# Patient Record
Sex: Female | Born: 1935 | Race: White | Hispanic: No | Smoking: Never smoker
Health system: Southern US, Community
[De-identification: ages and names within clinical notes are randomized; demographics above are authoritative.]

## PROBLEM LIST (undated history)

## (undated) DIAGNOSIS — I5030 Unspecified diastolic (congestive) heart failure: Secondary | ICD-10-CM

## (undated) DIAGNOSIS — E079 Disorder of thyroid, unspecified: Secondary | ICD-10-CM

## (undated) DIAGNOSIS — I509 Heart failure, unspecified: Secondary | ICD-10-CM

## (undated) DIAGNOSIS — G4730 Sleep apnea, unspecified: Secondary | ICD-10-CM

## (undated) DIAGNOSIS — C55 Malignant neoplasm of uterus, part unspecified: Secondary | ICD-10-CM

## (undated) DIAGNOSIS — I272 Pulmonary hypertension, unspecified: Secondary | ICD-10-CM

## (undated) DIAGNOSIS — S92901A Unspecified fracture of right foot, initial encounter for closed fracture: Secondary | ICD-10-CM

## (undated) DIAGNOSIS — I10 Essential (primary) hypertension: Secondary | ICD-10-CM

## (undated) DIAGNOSIS — N289 Disorder of kidney and ureter, unspecified: Secondary | ICD-10-CM

## (undated) HISTORY — PX: BACK SURGERY: SHX140

## (undated) HISTORY — PX: TOTAL HIP ARTHROPLASTY: SHX124

## (undated) HISTORY — DX: Malignant neoplasm of uterus, part unspecified: C55

## (undated) HISTORY — PX: ABDOMINAL HYSTERECTOMY: SHX81

## (undated) HISTORY — PX: KNEE ARTHROSCOPY: SUR90

---

## 1998-01-20 ENCOUNTER — Other Ambulatory Visit: Admission: RE | Admit: 1998-01-20 | Discharge: 1998-01-20 | Payer: Self-pay | Admitting: Family Medicine

## 1998-07-17 ENCOUNTER — Ambulatory Visit (HOSPITAL_COMMUNITY): Admission: RE | Admit: 1998-07-17 | Discharge: 1998-07-17 | Payer: Self-pay | Admitting: Family Medicine

## 2000-06-13 ENCOUNTER — Encounter: Payer: Self-pay | Admitting: Family Medicine

## 2000-06-13 ENCOUNTER — Encounter: Admission: RE | Admit: 2000-06-13 | Discharge: 2000-06-13 | Payer: Self-pay | Admitting: Family Medicine

## 2001-08-07 ENCOUNTER — Encounter: Payer: Self-pay | Admitting: Family Medicine

## 2001-08-07 ENCOUNTER — Encounter: Admission: RE | Admit: 2001-08-07 | Discharge: 2001-08-07 | Payer: Self-pay | Admitting: Family Medicine

## 2002-05-12 ENCOUNTER — Encounter: Admission: RE | Admit: 2002-05-12 | Discharge: 2002-05-12 | Payer: Self-pay | Admitting: Family Medicine

## 2002-05-12 ENCOUNTER — Encounter: Payer: Self-pay | Admitting: Family Medicine

## 2002-08-05 ENCOUNTER — Encounter: Admission: RE | Admit: 2002-08-05 | Discharge: 2002-08-05 | Payer: Self-pay | Admitting: Family Medicine

## 2002-08-05 ENCOUNTER — Encounter: Payer: Self-pay | Admitting: Family Medicine

## 2003-04-05 ENCOUNTER — Ambulatory Visit (HOSPITAL_COMMUNITY): Admission: RE | Admit: 2003-04-05 | Discharge: 2003-04-05 | Payer: Self-pay | Admitting: Neurosurgery

## 2003-04-27 ENCOUNTER — Inpatient Hospital Stay (HOSPITAL_COMMUNITY): Admission: RE | Admit: 2003-04-27 | Discharge: 2003-05-05 | Payer: Self-pay | Admitting: Neurosurgery

## 2003-05-31 ENCOUNTER — Inpatient Hospital Stay (HOSPITAL_COMMUNITY): Admission: AD | Admit: 2003-05-31 | Discharge: 2003-06-06 | Payer: Self-pay | Admitting: Neurosurgery

## 2003-06-06 ENCOUNTER — Encounter: Payer: Self-pay | Admitting: Neurosurgery

## 2003-06-13 ENCOUNTER — Encounter (HOSPITAL_COMMUNITY): Admission: RE | Admit: 2003-06-13 | Discharge: 2003-09-11 | Payer: Self-pay | Admitting: Neurosurgery

## 2003-06-15 ENCOUNTER — Encounter (HOSPITAL_COMMUNITY): Admission: RE | Admit: 2003-06-15 | Discharge: 2003-07-14 | Payer: Self-pay | Admitting: Neurosurgery

## 2003-06-19 ENCOUNTER — Encounter: Payer: Self-pay | Admitting: Emergency Medicine

## 2003-06-19 ENCOUNTER — Inpatient Hospital Stay (HOSPITAL_COMMUNITY): Admission: EM | Admit: 2003-06-19 | Discharge: 2003-06-23 | Payer: Self-pay

## 2003-06-19 ENCOUNTER — Encounter: Payer: Self-pay | Admitting: Family Medicine

## 2003-06-22 ENCOUNTER — Encounter: Payer: Self-pay | Admitting: Internal Medicine

## 2003-09-14 ENCOUNTER — Encounter: Admission: RE | Admit: 2003-09-14 | Discharge: 2003-09-14 | Payer: Self-pay | Admitting: Family Medicine

## 2003-10-26 ENCOUNTER — Encounter: Admission: RE | Admit: 2003-10-26 | Discharge: 2003-10-26 | Payer: Self-pay | Admitting: Infectious Diseases

## 2003-12-14 ENCOUNTER — Encounter: Admission: RE | Admit: 2003-12-14 | Discharge: 2003-12-14 | Payer: Self-pay | Admitting: Infectious Diseases

## 2004-03-05 ENCOUNTER — Inpatient Hospital Stay (HOSPITAL_COMMUNITY): Admission: RE | Admit: 2004-03-05 | Discharge: 2004-03-12 | Payer: Self-pay | Admitting: Orthopedic Surgery

## 2004-04-17 ENCOUNTER — Inpatient Hospital Stay (HOSPITAL_COMMUNITY): Admission: EM | Admit: 2004-04-17 | Discharge: 2004-04-30 | Payer: Self-pay | Admitting: Emergency Medicine

## 2004-04-18 ENCOUNTER — Encounter: Payer: Self-pay | Admitting: Cardiology

## 2004-04-30 ENCOUNTER — Encounter (HOSPITAL_COMMUNITY): Admission: RE | Admit: 2004-04-30 | Discharge: 2004-06-22 | Payer: Self-pay | Admitting: Orthopedic Surgery

## 2004-05-07 ENCOUNTER — Encounter: Admission: RE | Admit: 2004-05-07 | Discharge: 2004-05-07 | Payer: Self-pay | Admitting: Infectious Diseases

## 2004-06-22 ENCOUNTER — Ambulatory Visit: Payer: Self-pay | Admitting: Infectious Diseases

## 2004-08-08 ENCOUNTER — Ambulatory Visit: Payer: Self-pay | Admitting: Infectious Diseases

## 2004-11-26 ENCOUNTER — Encounter: Admission: RE | Admit: 2004-11-26 | Discharge: 2004-11-26 | Payer: Self-pay | Admitting: Family Medicine

## 2005-05-09 ENCOUNTER — Encounter: Admission: RE | Admit: 2005-05-09 | Discharge: 2005-05-09 | Payer: Self-pay | Admitting: Gastroenterology

## 2005-05-28 ENCOUNTER — Ambulatory Visit (HOSPITAL_COMMUNITY): Admission: RE | Admit: 2005-05-28 | Discharge: 2005-05-28 | Payer: Self-pay | Admitting: Gastroenterology

## 2005-11-05 ENCOUNTER — Ambulatory Visit (HOSPITAL_COMMUNITY): Admission: RE | Admit: 2005-11-05 | Discharge: 2005-11-06 | Payer: Self-pay | Admitting: Ophthalmology

## 2006-05-07 ENCOUNTER — Encounter: Admission: RE | Admit: 2006-05-07 | Discharge: 2006-05-07 | Payer: Self-pay | Admitting: Family Medicine

## 2007-06-17 ENCOUNTER — Encounter: Admission: RE | Admit: 2007-06-17 | Discharge: 2007-06-17 | Payer: Self-pay | Admitting: Family Medicine

## 2008-08-10 ENCOUNTER — Encounter: Admission: RE | Admit: 2008-08-10 | Discharge: 2008-08-10 | Payer: Self-pay | Admitting: Family Medicine

## 2009-07-07 ENCOUNTER — Ambulatory Visit: Payer: Self-pay | Admitting: Pulmonary Disease

## 2009-07-07 ENCOUNTER — Inpatient Hospital Stay (HOSPITAL_COMMUNITY): Admission: EM | Admit: 2009-07-07 | Discharge: 2009-07-12 | Payer: Self-pay | Admitting: Emergency Medicine

## 2010-06-30 ENCOUNTER — Emergency Department (HOSPITAL_COMMUNITY)
Admission: EM | Admit: 2010-06-30 | Discharge: 2010-06-30 | Payer: Self-pay | Source: Home / Self Care | Admitting: Emergency Medicine

## 2010-07-24 ENCOUNTER — Encounter: Payer: Self-pay | Admitting: Cardiology

## 2010-08-06 ENCOUNTER — Encounter: Payer: Self-pay | Admitting: Cardiology

## 2010-08-09 ENCOUNTER — Ambulatory Visit: Payer: Self-pay | Admitting: Cardiology

## 2010-08-09 DIAGNOSIS — R0609 Other forms of dyspnea: Secondary | ICD-10-CM | POA: Insufficient documentation

## 2010-08-09 DIAGNOSIS — I498 Other specified cardiac arrhythmias: Secondary | ICD-10-CM

## 2010-08-09 DIAGNOSIS — I5032 Chronic diastolic (congestive) heart failure: Secondary | ICD-10-CM

## 2010-08-09 DIAGNOSIS — R0989 Other specified symptoms and signs involving the circulatory and respiratory systems: Secondary | ICD-10-CM

## 2010-08-13 ENCOUNTER — Telehealth (INDEPENDENT_AMBULATORY_CARE_PROVIDER_SITE_OTHER): Payer: Self-pay | Admitting: *Deleted

## 2010-08-21 ENCOUNTER — Encounter: Payer: Self-pay | Admitting: Pulmonary Disease

## 2010-08-21 ENCOUNTER — Encounter: Payer: Self-pay | Admitting: Cardiology

## 2010-08-21 ENCOUNTER — Ambulatory Visit (HOSPITAL_BASED_OUTPATIENT_CLINIC_OR_DEPARTMENT_OTHER)
Admission: RE | Admit: 2010-08-21 | Discharge: 2010-08-21 | Payer: Self-pay | Source: Home / Self Care | Admitting: Cardiology

## 2010-08-22 ENCOUNTER — Ambulatory Visit: Payer: Self-pay | Admitting: Pulmonary Disease

## 2010-08-23 ENCOUNTER — Ambulatory Visit: Payer: Self-pay | Admitting: Cardiology

## 2010-08-23 ENCOUNTER — Ambulatory Visit (HOSPITAL_COMMUNITY)
Admission: RE | Admit: 2010-08-23 | Discharge: 2010-08-23 | Payer: Self-pay | Source: Home / Self Care | Admitting: Cardiology

## 2010-08-23 ENCOUNTER — Ambulatory Visit: Payer: Self-pay

## 2010-08-23 ENCOUNTER — Encounter: Payer: Self-pay | Admitting: Cardiology

## 2010-08-28 ENCOUNTER — Ambulatory Visit: Payer: Self-pay | Admitting: Cardiology

## 2010-08-28 DIAGNOSIS — I2789 Other specified pulmonary heart diseases: Secondary | ICD-10-CM

## 2010-08-28 DIAGNOSIS — R0602 Shortness of breath: Secondary | ICD-10-CM

## 2010-08-28 DIAGNOSIS — I10 Essential (primary) hypertension: Secondary | ICD-10-CM | POA: Insufficient documentation

## 2010-08-29 LAB — CONVERTED CEMR LAB
BUN: 23 mg/dL (ref 6–23)
CO2: 32 meq/L (ref 19–32)
Chloride: 100 meq/L (ref 96–112)
Creatinine, Ser: 1.1 mg/dL (ref 0.4–1.2)
Glucose, Bld: 87 mg/dL (ref 70–99)

## 2010-08-30 ENCOUNTER — Ambulatory Visit: Payer: Self-pay | Admitting: Pulmonary Disease

## 2010-08-30 DIAGNOSIS — G4733 Obstructive sleep apnea (adult) (pediatric): Secondary | ICD-10-CM

## 2010-09-06 ENCOUNTER — Ambulatory Visit (HOSPITAL_COMMUNITY)
Admission: RE | Admit: 2010-09-06 | Discharge: 2010-09-06 | Payer: Self-pay | Source: Home / Self Care | Attending: Cardiology | Admitting: Cardiology

## 2010-09-20 ENCOUNTER — Telehealth (INDEPENDENT_AMBULATORY_CARE_PROVIDER_SITE_OTHER): Payer: Self-pay | Admitting: *Deleted

## 2010-09-28 ENCOUNTER — Encounter: Payer: Self-pay | Admitting: Pulmonary Disease

## 2010-10-11 ENCOUNTER — Ambulatory Visit (HOSPITAL_BASED_OUTPATIENT_CLINIC_OR_DEPARTMENT_OTHER)
Admission: RE | Admit: 2010-10-11 | Discharge: 2010-10-11 | Payer: Self-pay | Source: Home / Self Care | Attending: Pulmonary Disease | Admitting: Pulmonary Disease

## 2010-10-11 ENCOUNTER — Encounter: Payer: Self-pay | Admitting: Pulmonary Disease

## 2010-10-21 LAB — CONVERTED CEMR LAB
BUN: 23 mg/dL (ref 6–23)
Chloride: 99 meq/L (ref 96–112)
Creatinine, Ser: 1.2 mg/dL (ref 0.4–1.2)
GFR calc non Af Amer: 47.73 mL/min (ref >60)
Glucose, Bld: 80 mg/dL (ref 70–99)

## 2010-10-23 NOTE — Procedures (Signed)
Summary: summary report  summary report   Imported By: Mirna Mires 08/28/2010 16:30:50  _____________________________________________________________________  External Attachment:    Type:   Image     Comment:   External Document

## 2010-10-23 NOTE — Progress Notes (Signed)
Summary: holter monitor  Phone Note Outgoing Call   Call placed by: Marcos Eke,  August 13, 2010 1:24 PM Action Taken: Appt scheduled Summary of Call: Pt would like to have monitor the same day of echo Initial call taken by: Marcos Eke,  August 13, 2010 1:24 PM

## 2010-10-23 NOTE — Letter (Signed)
Summary: Physician Order [facility]  Physician Order for [facility]   Imported By: Marylou Mccoy 08/15/2010 14:22:51  _____________________________________________________________________  External Attachment:    Type:   Image     Comment:   External Document

## 2010-10-23 NOTE — Assessment & Plan Note (Signed)
Summary: np6/dx:SOB;edema/lg   Visit Type:  Initial Consult Primary Provider:  Mady Gemma   History of Present Illness: 77 yo with history of morbid obesity and suspected obesity-hypoventilation syndrome presents for cardiology evaluation in the setting of shortness of breath.  Patient has a long history of dyspnea.  She has been short of breath just walking around her house for several years.  She was admitted to [facility] in 10/10 with shortness of breath and was treated for CHF and obesity-hypoventilation.  She was sent home on oxygen but subsequently stopped it.  She was supposed to followup with pulmonology for a sleep study but it does not appear that this was ever done.  She denies chest pain, lightheadedness, or syncope.  Main limitation is simply significant shortness of breath that significantly limits her.  She has some orthopnea and sleeps on her side.  She has chronic lower leg swelling.  No PND.  Finally, when she was hospitalized in 10/10 she was noted to have bradycardia and pauses on telemetry.  She actually had a sinus pause on her ECG done here today.   O2 sat at rest 92%, O2 sat with ambution 88%.   ECG: NSR but had episode of sinus pause at the end of the ECG.   Labs (11/11): BNP 154, HCT 39.9  Current Medications (verified): 1)  Benicar 40 Mg Tabs (Olmesartan Medoxomil) .... Take One Tablet By Mouth Daily 2)  Furosemide 40 Mg Tabs (Furosemide) .... Take One Tablet By Mouth Two Times A Day 3)  Taztia Xt 360 Mg Xr24h-Cap (Diltiazem Hcl Er Beads) .... Once Daily 4)  Voltaren-Xr 100 Mg Xr24h-Tab (Diclofenac Sodium) .... Once Daily 5)  Fluconazole 200 Mg Tabs (Fluconazole) .... Uad 6)  Levoxyl 137 Mcg Tabs (Levothyroxine Sodium) .... Once Daily 7)  Meclizine Hcl 25 Mg Tabs (Meclizine Hcl) .... As Needed 8)  Omeprazole 20 Mg Cpdr (Omeprazole) .... Two Times A Day  Allergies: 1)  ! * Crestor 2)  ! Keflex 3)  ! Norvasc 4)  ! Zocor  Past History:  Past Medical  History: 1. Hypertension: ACEI cough 2. Hyperlipidemia 3. Hypothyroidism 4. Past history of MRSA 5. Peptic ulcer disease 6. History of cervical cancer 7. Obesity-hypoventilation syndrome, suspect OSA 8. Fe deficiency anemia 9. Morbid obesity 10. Nocturnal bradycardia 11. Echo (7/05): Technically difficult study with EF 55-65%.  12. s/p total hip replacement 13. GERD  Family History: Reviewed history from 08/07/2010 and no changes required. Significant for father with lung cancer A brother with acute MI  Social History: Tobacco Use - No.  Alcohol Use - no Drug Use - no Widowed  Daughter comes to appointments.   Review of Systems       All systems reviewed and negative except as per HPI.   Vital Signs:  Patient profile:   77 year old female Height:      68 inches Weight:      361 pounds BMI:     55.09 Pulse rate:   74 / minute BP sitting:   140 / 88  (left arm)  Vitals Entered By: Laurance Flatten CMA (August 09, 2010 11:40 AM) Comments Baseline o2 sat. 92 pulse 59  After walking o2 sat. 88 pulse 96    Physical Exam  General:  Well developed, well nourished, in no acute distress. Morbidly obese.  Head:  normocephalic and atraumatic Nose:  no deformity, discharge, inflammation, or lesions Mouth:  Teeth, gums and palate normal. Oral mucosa normal. Neck:  Neck  supple, JVP 10 cm. No masses, thyromegaly or abnormal cervical nodes. Lungs:  Clear bilaterally to auscultation and percussion. Heart:  Non-displaced PMI, chest non-tender; regular rate and rhythm, S1, S2 without rubs or gallops. 2/6 early systolic murmur RUSB.  Carotid upstroke normal, no bruit.  2+ edema to knees bilaterally.  Pedal pulses difficult due to edema.  Abdomen:  Bowel sounds positive; abdomen soft and non-tender without masses, organomegaly, or hernias noted. No hepatosplenomegaly. Extremities:  No clubbing or cyanosis. Neurologic:  Alert and oriented x 3. Skin:  Intact without lesions or  rashes. Psych:  Normal affect.   Impression & Recommendations:  Problem # 1:  CHRONIC DIASTOLIC HEART FAILURE (ICD-428.32) Patient is morbidly obese with evidence for volume overload on exam.  I suspect that her dyspnea is multifactorial: diastolic CHF, obesity/deconditioning, also suspect obesity-hypoventilation syndrome and possible OSA as well as possible pulmonary hypertension from obesity-hypoventilation.  BNP mildly elevated on recent lab draw.  - Increase Lasix to 40 mg by mouth two times a day (only taking it once a day) with KCl 20 mEq daily.  BMET/BNP in 2 wks.  - Echocardiogram with attention to right ventricle and PA pressure. - Sodium restriction - Referral to pulmonary for evaluation of OHS/OSA.  Will also arrange sleep study.    Problem # 2:  MORBID OBESITY (ICD-278.01) Weight loss is paramount.  Will refer to nutrition.   Problem # 3:  BRADYCARDIA (ICD-427.89) History of bradycardia with pauses.  This has been noted in the past and she has been asymptomatic.  HR 74 today but short pause noted on ECG.  She is on diltiazem CD.  I will get a 48 hour holter and will likely need to stop diltiazem.  I suspect that sleep apnea/hypoxia could be causing the patient's bradycardia and this needs to be treated.   Other Orders: Holter Monitor (Holter Monitor) Misc. Referral (Misc. Ref) Sleep Disorder Referral (Sleep Disorder) Echocardiogram (Echo) Pulmonary Referral (Pulmonary) TLB-BMP (Basic Metabolic Panel-BMET) (80048-METABOL)  Patient Instructions: 1)  Your physician has recommended you make the following change in your medication:  2)  Take Aspirin 81mg  daily--this should be buffered or coated. 3)  Take Lasix(furosemide) 40mg  twice a day--take one in the morning and one in the afternoon around 3 or 4 PM. 4)  Take KCL(potassium) 20 mEq daily. 5)  Lab  today--BMP 428.32 6)  Your physician recommends that you schedule a follow-up appointment in: 2 weeks with Dr Shirlee Latch.  7)  Lab  in 2 weeks when you see Dr Brodan Grewell--BMP/BNP 428.32 8)  Your physician has requested that you have an echocardiogram.  Echocardiography is a painless test that uses sound waves to create images of your heart. It provides your doctor with information about the size and shape of your heart and how well your heart's chambers and valves are working.  This procedure takes approximately one hour. There are no restrictions for this procedure. 9)  Your physician has recommended that you wear a holter monitor.  Holter monitors are medical devices that record the heart's electrical activity. Doctors most often use these monitors to diagnose arrhythmias. Arrhythmias are problems with the speed or rhythm of the heartbeat. The monitor is a small, portable device. You can wear one while you do your normal daily activities. This is usually used to diagnose what is causing palpitations/syncope (passing out). 48 hour monitor 10)  Your physician has recommended that you have a sleep study.  This test records several body functions during sleep, including:  brain activity, eye movement, oxygen and carbon dioxide blood levels, heart rate and rhythm, breathing rate and rhythm, the flow of air through your mouth and nose, snoring, body muscle movements, and chest and belly movement. 11)  Your physician has requested that you limit the intake of sodium (salt) in your diet to 1500mg  sodium  daily. Please see [facility] handout. 12)  You have been referred to a nutritionist. 13)  You have been referred to pulmonary. Prescriptions: KLOR-CON M20 20 MEQ CR-TABS (POTASSIUM CHLORIDE CRYS CR) one daily  #30 x 6   Entered by:   Katina Dung, RN, BSN   Authorized by:   Marca Ancona, MD   Signed by:   Katina Dung, RN, BSN on 08/09/2010   Method used:   Electronically to        [facility]  [address] [phone][facility] (retail)       [address]       [place], [place]  [address]       Ph: [phone] or [phone]       Fax: [phone]    RxID:   [id number]   Pt  declined nutrition consult Luana Shu

## 2010-10-23 NOTE — Letter (Signed)
Summary: [facility] Orders   [facility] Orders   Imported By: Roderic Ovens 08/28/2010 15:48:14  _____________________________________________________________________  External Attachment:    Type:   Image     Comment:   External Document

## 2010-10-23 NOTE — Assessment & Plan Note (Signed)
Summary: 2 WKS/D.MILLER   Visit Type:  2 wks f/u Primary Provider:  Mady Gemma  CC:  edema/legs but states this is getting better and sob w/walking....denies any cp.  History of Present Illness: 77 yo with history of morbid obesity and suspected obesity-hypoventilation syndrome returns for cardiology evaluation in the setting of shortness of breath.  Patient has a long history of dyspnea.  She has been short of breath just walking around her house for several years.  She was admitted to [facility] in 10/10 with shortness of breath and was treated for CHF and obesity-hypoventilation.  She was sent home on oxygen but subsequently stopped it.  She was supposed to followup with pulmonology for a sleep study but it does not appear that this was ever done.  She denies chest pain, lightheadedness, or syncope.  Main limitation is simply shortness of breath that significantly limits her.  She has some orthopnea and sleeps on her side.  She has chronic lower leg swelling.  No PND.  At last appointment,  O2 sat at rest 92% and O2 sat with ambulation 88%.   At last appointment, I had her start Lasix 40 mg two times a day.  She has been taking this as ordered since then and has lost 4 lbs since last appointment.  She thinks is breathing better, though she still needs a walker to get around and gets short of breath walking around the house.  She had a sleep study done showing severe sleep apnea with sinus pauses corresponding to apneic episodes.  Hotler showed multiple sinus pauses up to 4 seconds.  She denies lightheadedness or syncope.  Echo showed normal systolic function with a mildly dilated RV and moderate pulmonary hypertension.    Labs (11/11): BNP 154, HCT 39.9  Current Medications (verified): 1)  Benicar 40 Mg Tabs (Olmesartan Medoxomil) .... Take One Tablet By Mouth Daily 2)  Furosemide 40 Mg Tabs (Furosemide) .... Take One Tablet By Mouth Two Times A Day 3)  Taztia Xt 360 Mg Xr24h-Cap (Diltiazem  Hcl Er Beads) .... Once Daily 4)  Voltaren-Xr 100 Mg Xr24h-Tab (Diclofenac Sodium) .... Once Daily 5)  Fluconazole 200 Mg Tabs (Fluconazole) .... Uad 6)  Levoxyl 137 Mcg Tabs (Levothyroxine Sodium) .... Once Daily 7)  Meclizine Hcl 25 Mg Tabs (Meclizine Hcl) .... As Needed 8)  Omeprazole 20 Mg Cpdr (Omeprazole) .... Two Times A Day 9)  Klor-Con M20 20 Meq Cr-Tabs (Potassium Chloride Crys Cr) .... One Daily  Allergies: 1)  ! * Crestor 2)  ! Keflex 3)  ! Norvasc 4)  ! Zocor  Past History:  Past Medical History: 1. Hypertension: ACEI cough 2. Hyperlipidemia 3. Hypothyroidism 4. Past history of MRSA 5. Peptic ulcer disease 6. History of cervical cancer 7. OHS/OSA: Sleep study with severe OSA (11/11).  Oxygen sat 92% at rest, 88% with ambulation.  8. Fe deficiency anemia 9. Morbid obesity 10. Nocturnal bradycardia: Holter (11/11) with multiple sinus pauses up to 4 seconds in length.   11. Pulmonary hypertension: Echo (11/11) with EF 55-60%, grade I diastolic dysfunction, mild LV hypertrophy, mildly dilated RV with PA systolic pressure 51 mmHg.  12. s/p total hip replacement 13. GERD  Family History: Reviewed history from 08/07/2010 and no changes required. Significant for father with lung cancer A brother with acute MI  Social History: Reviewed history from 08/09/2010 and no changes required. Tobacco Use - No.  Alcohol Use - no Drug Use - no Widowed  Daughter comes to appointments.  Review of Systems       All systems reviewed and negative except as per HPI.   Vital Signs:  Patient profile:   77 year old female Height:      68 inches Weight:      357.75 pounds BMI:     54.59 Pulse rate:   76 / minute Pulse rhythm:   irregular BP sitting:   148 / 70  (right arm) Cuff size:   large  Vitals Entered By: Vikki Ports (August 28, 2010 12:23 PM)  Physical Exam  General:  Well developed, well nourished, in no acute distress. Morbidly obese.  Neck:  Neck supple,  JVP 8 cm. No masses, thyromegaly or abnormal cervical nodes. Lungs:  Clear bilaterally to auscultation and percussion. Heart:  Non-displaced PMI, chest non-tender; regular rate and rhythm, S1, S2 without rubs or gallops. 2/6 early systolic murmur RUSB.  Carotid upstroke normal, no bruit.  1+ edema 1/2 up lower legs bilaterally. Pedal pulses difficult due to edema.  Abdomen:  Bowel sounds positive; abdomen soft and non-tender without masses, organomegaly, or hernias noted. No hepatosplenomegaly. Extremities:  No clubbing or cyanosis. Neurologic:  Alert and oriented x 3. Psych:  Normal affect.   Impression & Recommendations:  Problem # 1:  BRADYCARDIA (ICD-427.89) Nocturnal bradycardia with sinus pauses on holter up to 4 seconds.  On sleep study, had sinus pauses corresponding to apneic episodes.  No syncope or presyncope.  First step will be treatment of OSA as this may resolve the bradyarrhythmias.  I will also stop diltiazem.    Problem # 2:  SHORTNESS OF BREATH (ICD-786.05) I suspect that this is due to obesity/deconditioning, OHS/OSA, and pulmonary hypertension.  She does continue to have some volume overload on exam but taking Lasix 40 mg two times a day has helped.  Would continue Lasix at this dose.  She needs to get on CPAP.  She is going to see Dr. Shelle Iron later this week.   Problem # 3:  PULMONARY HYPERTENSION (ICD-416.8) Moderate pulmonary hypertension by echo.  I suspect that this is due primarily to hypoxemia from OHS/OSA.  She needs to start CPAP, and ? whether she may need oxygen during day (only desaturated to 88% with ambulation but given pulmonary hypertension would have a low threshold to use oxygen).  I will get a V/Q scan to rule out chronic thromboembolic disease.   Problem # 4:  HYPERTENSION, UNSPECIFIED (ICD-401.9) Discontinue diltiazem given bradyarrhythmias.  Will start amlodipine 5 mg daily for BP control.    Other Orders: Misc. Referral (Misc. Ref) T-2 View CXR  (71020TC)  Patient Instructions: 1)  Your physician recommends that you have lab today--BMP/BNP 786.09 416.8 2)  Stop Diltiazem/Tiazac. 3)  Start Norvasc 5mg  daily. 4)  We will schedule you for a VQ lung scan. 5)  Keep the appointment Thursday December 8 at 2:15  with Dr Norma Fredrickson pulmonary doctor. 6)  Your physician recommends that you schedule a follow-up appointment in: 2 months with Dr Shirlee Latch. Prescriptions: NORVASC 5 MG TABS (AMLODIPINE BESYLATE) one daily  #30 x 6   Entered by:   Katina Dung, RN, BSN   Authorized by:   Marca Ancona, MD   Signed by:   Katina Dung, RN, BSN on 08/28/2010   Method used:   [facility] to        [facility]  [address] [phone][facility] ([facility]ail[facility]       [address]  [place], [place]  [address]       Ph: [phone] or [phone]       Fax: [phone]   RxID:   [id number]

## 2010-10-23 NOTE — Letter (Signed)
Summary: [facility] - Office Visit  [facility] - Office Visit   Imported By: Marylou Mccoy 08/15/2010 13:23:14  _____________________________________________________________________  External Attachment:    Type:   Image     Comment:   External Document

## 2010-10-25 DIAGNOSIS — I499 Cardiac arrhythmia, unspecified: Secondary | ICD-10-CM

## 2010-10-25 DIAGNOSIS — I4949 Other premature depolarization: Secondary | ICD-10-CM

## 2010-10-25 DIAGNOSIS — G4733 Obstructive sleep apnea (adult) (pediatric): Secondary | ICD-10-CM

## 2010-10-25 NOTE — Letter (Signed)
Summary: Statement of Medical Necessity / [facility]  Statement of Medical Necessity / [facility]   Imported By: Lennie Odor 10/04/2010 17:21:00  _____________________________________________________________________  External Attachment:    Type:   Image     Comment:   External Document

## 2010-10-25 NOTE — Assessment & Plan Note (Signed)
Summary: consult for severe sleep apnea   Visit Type:  Initial Consult Copy to:  Marca Ancona MD Primary Provider/Referring Provider:  Mady Gemma  CC:  abnormal sleep study and shortness of breath.  History of Present Illness: the pt is a 77y/o female who I have been asked to see for management of osa.  She has had a recent npsg where she was found to have very severe sleep apnea with AHI 111/hr, and desat to 80%.  She was also found to have sinus pauses associated with her apnea.  She is a known snorer, but no one has mentioned pauses in her breathing during sleep.  The pt typically goes to bed at 10pm, and arises at 7am to start her day.  She is never rested in the am's upon arising, and describes significant daytime sleepiness while trying to read or watch tv.  She denies sleepiness while driving.  She tells me her weight has increased 50 pounds in the last 2 years.  The pt has also been noted to have multifactorial dyspnea, and recent echo does show pulmonary hypertension.    Current Medications (verified): 1)  Benicar 40 Mg Tabs (Olmesartan Medoxomil) .... Take One Tablet By Mouth Daily 2)  Furosemide 40 Mg Tabs (Furosemide) .... Take One Tablet By Mouth Two Times A Day 3)  Norvasc 5 Mg Tabs (Amlodipine Besylate) .... One Daily 4)  Voltaren-Xr 100 Mg Xr24h-Tab (Diclofenac Sodium) .... Once Daily 5)  Fluconazole 200 Mg Tabs (Fluconazole) .... Uad 6)  Levoxyl 137 Mcg Tabs (Levothyroxine Sodium) .... Once Daily 7)  Meclizine Hcl 25 Mg Tabs (Meclizine Hcl) .... As Needed 8)  Omeprazole 20 Mg Cpdr (Omeprazole) .... Two Times A Day 9)  Klor-Con M20 20 Meq Cr-Tabs (Potassium Chloride Crys Cr) .... One Daily 10)  Aspirin 81 Mg Tbec (Aspirin) .... One Daily  Allergies (verified): No Known Drug Allergies  Past History:  Past Medical History: Reviewed history from 08/28/2010 and no changes required. 1. Hypertension: ACEI cough 2. Hyperlipidemia 3. Hypothyroidism 4. Past history of  MRSA 5. Peptic ulcer disease 6. History of cervical cancer 7. OHS/OSA: Sleep study with severe OSA (11/11).  Oxygen sat 92% at rest, 88% with ambulation.  8. Fe deficiency anemia 9. Morbid obesity 10. Nocturnal bradycardia: Holter (11/11) with multiple sinus pauses up to 4 seconds in length.   11. Pulmonary hypertension: Echo (11/11) with EF 55-60%, grade I diastolic dysfunction, mild LV hypertrophy, mildly dilated RV with PA systolic pressure 51 mmHg.  12. s/p total hip replacement 13. GERD  Past Surgical History: Reviewed history from 08/07/2010 and no changes required.  Lumbar laminectomy infusion, October, 2004 (which  subsequently became infected the following month).  Hysterectomy  Hemorrhoidectomy  Most recent left total hip replacement arthroplasty in June of 2005  Family History: Reviewed history from 08/07/2010 and no changes required. Significant for father with lung cancer A brother with acute MI  Social History: Reviewed history from 08/09/2010 and no changes required. Never Smoked Alcohol Use - no Drug Use - no Widowed  Daughter comes to appointments.  retired: Insurance underwriter order  Review of Systems       The patient complains of shortness of breath with activity, hand/feet swelling, and joint stiffness or pain.  The patient denies shortness of breath at rest, productive cough, non-productive cough, coughing up blood, chest pain, irregular heartbeats, acid heartburn, indigestion, loss of appetite, weight change, abdominal pain, difficulty swallowing, sore throat, tooth/dental problems, headaches, nasal congestion/difficulty breathing through nose,  sneezing, itching, ear ache, anxiety, depression, rash, change in color of mucus, and fever.    Vital Signs:  Patient profile:   77 year old female Height:      68 inches Weight:      360.25 pounds BMI:     54.97 O2 Sat:      95 % on Room air Temp:     97.8 degrees F oral Pulse rate:   75 / minute BP sitting:   130 /  80  (left arm) Cuff size:   large  Vitals Entered By: Carver Fila (77 year old female Height:  Room air CC: abnormal sleep study and shortness of breath Comments meds and allergies updated Phone number updated Mindy Silva  August 30, 2010 2:30 PM    Physical Exam  General:  morbidly obese female in nad Eyes:  PERRLA and EOMI.   Nose:  edematous mucosa and turbinates with partial obstruction bilat. Mouth:  very small posterior pharyngeal space, elongation of soft palate and uvula. Neck:  large, no jvd, tmg, LN Lungs:  clear to auscultation, no wheezing or rhonchi Heart:  rrr, 2/6 sem Abdomen:  soft and nontender, bs+ Extremities:  2+ edema, varicosities noted. no cyanosis pulses intact distally but decreased. Neurologic:  alert and oriented, moves all 4.   Impression & Recommendations:  Problem # 1:  OBSTRUCTIVE SLEEP APNEA (ICD-327.23) the pt has very severe osa by her sleep study with associated sinus pauses.  She also has known pulmonary hypertension which is probably due to her osa along with diastolic dysfunction.  I have had a long discussion with the pt about sleep apnea, including its impact on QOL and CV health.  The only adequate treatment options for her at this point are cpap and weight loss.  The pt is willing to give it a try.  I will set the patient up on cpap at a moderate pressure level to allow for desensitization, and will troubleshoot the device over the next 4-6weeks if needed.  The pt is to call me if having issues with tolerance.  Will then optimize the pressure once patient is able to wear cpap on a consistent basis.  Other Orders: Consultation Level IV (74259) DME Referral (DME) Sleep Disorder Referral (Sleep Disorder)  Patient Instructions: 1)  will start on cpap at a moderate level to begin treatment and allow you to adapt to the device 2)  will set up for titration study, given the severity of your sleep apnea, in order to optimize your  pressure for you. 3)  work on weight loss 4)  will see back after your titration study, but please call if having any tolerance issues with the cpap.   Immunization History:  Influenza Immunization History:    Influenza:  historical (07/24/2010)

## 2010-10-25 NOTE — Progress Notes (Signed)
Summary: insurance change/sleep study-[id number]  Phone Note Call from Patient Call back at [facility] Phone [phone]   Caller: Patient Call For: dr clance Summary of Call: Patient phoned she was scheduled for a sleep test tonight but was seen by her PCP and she has bronchitis and was told by the sleep center not to come for the test tonight. Patients insurance is changing as of 09/23/10 and her new insurance [employer] [place] [employer] reqires prior authorization from the doctor for the sleep study. Patient can be reached at [phone]  Initial call taken by: Vedia Coffer,  September 20, 2010 3:23 PM  Follow-up for Phone Call        [email or web address]. Carron Curie CMA  September 20, 2010 4:43 PM  pt states she will have new insurance as of 09/23/10 and it will be [employer] [place] [employer]--address--[employer] H[employer]- [address] [address], [place], [place] [address]--ph# [phone] VF#[id number]---AYTKZ # [id number], pt states new appt for sleep study will need precert, pt advised she will hear from [place] or Almyra Free once the new appt for sleep study and precert have been done  Follow-up by: Philipp Deputy CMA,  September 20, 2010 4:59 PM  Additional Follow-up for Phone Call Additional follow up Details #1::        spoke to [facility] MEDICARE  cpt code 23557 for cpap titration study does not require precert pt is aware  Additional Follow-up by: Oneita Jolly,  September 21, 2010 9:16 AM

## 2010-11-06 ENCOUNTER — Ambulatory Visit (INDEPENDENT_AMBULATORY_CARE_PROVIDER_SITE_OTHER): Payer: Medicare Other | Admitting: Pulmonary Disease

## 2010-11-06 ENCOUNTER — Encounter: Payer: Self-pay | Admitting: Pulmonary Disease

## 2010-11-06 DIAGNOSIS — G4733 Obstructive sleep apnea (adult) (pediatric): Secondary | ICD-10-CM

## 2010-11-06 DIAGNOSIS — G2589 Other specified extrapyramidal and movement disorders: Secondary | ICD-10-CM | POA: Insufficient documentation

## 2010-11-14 NOTE — Assessment & Plan Note (Signed)
Summary: rov to review cpap titration study.   Copy to:  Marca Ancona MD Primary Provider/Referring Provider:  Mady Gemma  CC:  Ov to discuss sleep study results.  Pt states she wears her cpap machine every night.  Approx 8 hours per night.  Pt c/o hoarseness since starting cpap and also c/o watery L eye that she thinks is d/t the mask. Marland Kitchen  History of Present Illness: the pt comes in today for review of her recent cpap titration study.  She was found to have an optimal pressure of 14cm, but also had very large numbers of leg jerks with significant sleep disruption.  It should be noted however, her limb movements significantly improved with optimal cpap pressure.  She was also noted to have occasional pvc's and fusion beats, as well as occasional episode of irregularity that may be transient afib??  I have reviewed the study in detail with her and her daughter, and answered all of their questions.  The pt feels she is sleeping much better with cpap, and her only complaint is that of "air blowing in my eyes and puffiness around my eye".    Current Medications (verified): 1)  Benicar 40 Mg Tabs (Olmesartan Medoxomil) .... Take One Tablet By Mouth Daily 2)  Furosemide 40 Mg Tabs (Furosemide) .... Take One Tablet By Mouth Two Times A Day 3)  Norvasc 5 Mg Tabs (Amlodipine Besylate) .... One Daily 4)  Voltaren-Xr 100 Mg Xr24h-Tab (Diclofenac Sodium) .... Once Daily 5)  Fluconazole 200 Mg Tabs (Fluconazole) .... Uad 6)  Levoxyl 137 Mcg Tabs (Levothyroxine Sodium) .... Once Daily 7)  Meclizine Hcl 25 Mg Tabs (Meclizine Hcl) .... As Needed 8)  Omeprazole 20 Mg Cpdr (Omeprazole) .... Take 1 Tablet By Mouth Once A Day 9)  Klor-Con M20 20 Meq Cr-Tabs (Potassium Chloride Crys Cr) .... One Daily  Allergies (verified): No Known Drug Allergies  Review of Systems       The patient complains of productive cough.  The patient denies shortness of breath with activity, shortness of breath at rest,  non-productive cough, coughing up blood, chest pain, irregular heartbeats, acid heartburn, indigestion, loss of appetite, weight change, abdominal pain, difficulty swallowing, sore throat, tooth/dental problems, headaches, nasal congestion/difficulty breathing through nose, sneezing, itching, ear ache, anxiety, depression, hand/feet swelling, joint stiffness or pain, rash, change in color of mucus, and fever.    Vital Signs:  Patient profile:   77 year old female Height:      68 inches Weight:      360.38 pounds BMI:     54.99 O2 Sat:      95 % on Room air Temp:     97.7 degrees F oral Pulse rate:   82 / minute BP sitting:   188 / 100  (left arm) Cuff size:   large  Vitals Entered By: Arman Filter LPN (November 06, 2010 12:01 PM)  O2 Flow:  Room air CC: Ov to discuss sleep study results.  Pt states she wears her cpap machine every night.  Approx 8 hours per night.  Pt c/o hoarseness since starting cpap and also c/o watery L eye that she thinks is d/t the mask.  Comments Medications reviewed with patient Arman Filter LPN  November 06, 2010 12:05 PM    Physical Exam  General:  obese female in nad Nose:  no skin breakdown or pressure necrosis from cpap mask Extremities:  edema noted, but no cyanosis  Neurologic:  alert, does not appear sleepy, moves  all 4    Impression & Recommendations:  Problem # 1:  OBSTRUCTIVE SLEEP APNEA (ICD-327.23) the pt has known very severe osa, and her recent titration study showed optimal pressure near 14cm.  The pt is having mask issues, and will need troubleshooting by her dme.  She has responded very favorably to cpap so far, and feels she is sleeping better with improved daytime alertness.  I have reiterated to her again the importance of weight loss  Problem # 2:  PERIODIC LIMB MOVEMENT DISORDER (ICD-333.99) the pt had very frequent leg kicks on her sleep study, but they resolved with optimal cpap.  This would tend to indicate they are more  related to her sleep disordered breathing than a primary movement disorder of sleep.  However, her clinical history is more suggestive of RLS, but she has many other reasons for leg discomfort.  At this point, would like to treat the pt with optimal cpap and see how she does.  If her legs continue to be a major issue, would consider an emperic trial of a dopamine agonist.  Medications Added to Medication List This Visit: 1)  Omeprazole 20 Mg Cpdr (Omeprazole) .... Take 1 tablet by mouth once a day  Other Orders: Est. Patient Level III (16109) DME Referral (DME)  Patient Instructions: 1)  will have your cpap pressure increased to 14cm, and will also have dme check your mask fit. 2)  please call me in 3-4 weeks, and give me update on how cpap is going, and if your leg symptoms are better. 3)  work on weight loss 4)  followup with me in office in 6mos if things are going well.

## 2010-12-05 LAB — URINALYSIS, ROUTINE W REFLEX MICROSCOPIC
Bilirubin Urine: NEGATIVE
Hgb urine dipstick: NEGATIVE
Ketones, ur: NEGATIVE mg/dL
Leukocytes, UA: NEGATIVE
Specific Gravity, Urine: 1.021 (ref 1.005–1.030)
Urobilinogen, UA: 0.2 mg/dL (ref 0.0–1.0)

## 2010-12-05 LAB — POCT I-STAT, CHEM 8
BUN: 17 mg/dL (ref 6–23)
Calcium, Ion: 1.1 mmol/L — ABNORMAL LOW (ref 1.12–1.32)
Creatinine, Ser: 1.3 mg/dL — ABNORMAL HIGH (ref 0.4–1.2)
HCT: 46 % (ref 36.0–46.0)
Sodium: 139 mEq/L (ref 135–145)
TCO2: 29 mmol/L (ref 0–100)

## 2010-12-05 LAB — URINE CULTURE: Culture  Setup Time: 201110091201

## 2010-12-05 LAB — DIFFERENTIAL
Basophils Absolute: 0 10*3/uL (ref 0.0–0.1)
Eosinophils Absolute: 0 10*3/uL (ref 0.0–0.7)
Eosinophils Relative: 0 % (ref 0–5)
Monocytes Absolute: 1.5 10*3/uL — ABNORMAL HIGH (ref 0.1–1.0)
Neutrophils Relative %: 92 % — ABNORMAL HIGH (ref 43–77)

## 2010-12-05 LAB — CULTURE, BLOOD (ROUTINE X 2)
Culture  Setup Time: 201110090105
Culture  Setup Time: 201110090105

## 2010-12-05 LAB — CBC: RBC: 4.76 MIL/uL (ref 3.87–5.11)

## 2010-12-27 LAB — BASIC METABOLIC PANEL
BUN: 11 mg/dL (ref 6–23)
BUN: 15 mg/dL (ref 6–23)
CO2: 27 mEq/L (ref 19–32)
CO2: 26 mEq/L (ref 19–32)
Chloride: 103 mEq/L (ref 96–112)
Creatinine, Ser: 1.27 mg/dL — ABNORMAL HIGH (ref 0.4–1.2)
GFR calc Af Amer: >60 mL/min (ref >60)
GFR calc Af Amer: 50 mL/min — ABNORMAL LOW (ref >60)
Glucose, Bld: 115 mg/dL — ABNORMAL HIGH (ref 70–99)
Glucose, Bld: 136 mg/dL — ABNORMAL HIGH (ref 70–99)
Potassium: 4.1 mEq/L (ref 3.5–5.1)
Potassium: 3.6 mEq/L (ref 3.5–5.1)
Sodium: 139 mEq/L (ref 135–145)

## 2010-12-27 LAB — CBC
HCT: 28.3 % — ABNORMAL LOW (ref 36.0–46.0)
HCT: 30.5 % — ABNORMAL LOW (ref 36.0–46.0)
Hemoglobin: 9 g/dL — ABNORMAL LOW (ref 12.0–15.0)
Hemoglobin: 10.4 g/dL — ABNORMAL LOW (ref 12.0–15.0)
MCHC: 31.9 g/dL (ref 30.0–36.0)
MCHC: 32.1 g/dL (ref 30.0–36.0)
MCHC: 31.5 g/dL (ref 30.0–36.0)
MCV: 77.3 fL — ABNORMAL LOW (ref 78.0–100.0)
MCV: 77.6 fL — ABNORMAL LOW (ref 78.0–100.0)
MCV: 77.8 fL — ABNORMAL LOW (ref 78.0–100.0)
Platelets: 185 10*3/uL (ref 150–400)
Platelets: 192 10*3/uL (ref 150–400)

## 2010-12-27 LAB — DIFFERENTIAL
Basophils Absolute: 0.1 10*3/uL (ref 0.0–0.1)
Basophils Relative: 0 % (ref 0–1)
Eosinophils Relative: 1 % (ref 0–5)
Lymphocytes Relative: 9 % — ABNORMAL LOW (ref 12–46)
Monocytes Absolute: 1.9 10*3/uL — ABNORMAL HIGH (ref 0.1–1.0)
Monocytes Relative: 12 % (ref 3–12)

## 2010-12-27 LAB — BLOOD GAS, ARTERIAL
Bicarbonate: 30.6 mEq/L — ABNORMAL HIGH (ref 20.0–24.0)
Bicarbonate: 28 mEq/L — ABNORMAL HIGH (ref 20.0–24.0)
FIO2: 0.21 %
O2 Content: 4 L/min
O2 Saturation: 83.8 %
O2 Saturation: 96.5 %
Patient temperature: 98.6
Patient temperature: 98.6
pH, Arterial: 7.34 — ABNORMAL LOW (ref 7.350–7.400)
pO2, Arterial: 49.1 mmHg — ABNORMAL LOW (ref 80.0–100.0)

## 2010-12-27 LAB — HEPATIC FUNCTION PANEL
AST: 20 U/L (ref 0–37)
Albumin: 3 g/dL — ABNORMAL LOW (ref 3.5–5.2)
Total Bilirubin: 0.7 mg/dL (ref 0.3–1.2)

## 2010-12-27 LAB — FOLATE: Folate: UNDETERMINED ng/mL

## 2010-12-27 LAB — URINE MICROSCOPIC-ADD ON

## 2010-12-27 LAB — URINALYSIS, ROUTINE W REFLEX MICROSCOPIC
Glucose, UA: NEGATIVE mg/dL
Hgb urine dipstick: NEGATIVE
Specific Gravity, Urine: 1.031 — ABNORMAL HIGH (ref 1.005–1.030)
Urobilinogen, UA: 1 mg/dL (ref 0.0–1.0)

## 2010-12-27 LAB — POCT CARDIAC MARKERS: CKMB, poc: <1 ng/mL — ABNORMAL LOW (ref 1.0–8.0)

## 2010-12-27 LAB — CULTURE, BLOOD (ROUTINE X 2): Culture: NO GROWTH

## 2010-12-27 LAB — TSH: TSH: 2.182 u[IU]/mL (ref 0.350–4.500)

## 2010-12-27 LAB — TYPE AND SCREEN: ABO/RH(D): O POS

## 2010-12-27 LAB — FERRITIN: Ferritin: UNDETERMINED ng/mL

## 2010-12-27 LAB — RETICULOCYTES
RBC.: 4.35 MIL/uL (ref 3.87–5.11)
Retic Count, Absolute: 47.9 10*3/uL (ref 19.0–186.0)
Retic Ct Pct: 1.1 % (ref 0.4–3.1)

## 2010-12-27 LAB — T4, FREE: Free T4: UNDETERMINED ng/dL (ref 0.80–1.80)

## 2011-01-03 ENCOUNTER — Encounter: Payer: Self-pay | Admitting: Pulmonary Disease

## 2011-02-08 NOTE — Op Note (Signed)
Judy Ramirez, Judy Ramirez              ACCOUNT NO.:  [id number]   MEDICAL RECORD NO.:  [id number]          PATIENT TYPE:  OIB   LOCATION:  5703                         FACILITY:  [facility]   PHYSICIAN:  Beulah Gandy. Ashley Royalty, M.D. DATE OF BIRTH:  1932/01/15   DATE OF PROCEDURE:  11/05/2005  DATE OF DISCHARGE:                                 OPERATIVE REPORT   ADMISSION DIAGNOSIS:  Rhegmatogenous retinal detachment, right eye.   PROCEDURE:  Scleral buckle, right eye; retinal photocoagulation, right eye;  C3F8 gas injection, right eye.   SURGEON:  Alan Mulder.   ASSISTANT:  Rosalie Doctor, MA   ANESTHESIA:  General.   DETAILS:  Usual prep and drape, 360 degrees limbal peritomy, isolation of  four rectus muscles on 2-0 silk. Localization of break at 12 o'clock with  cryo.  Scleral dissection from 8 o'clock to 1 o'clock to admit a number 279  intrascleral implant. Diathermy placed in the bed. Additional posterior  dissection carried out 12 o'clock beneath the break.  508G radial segment  plate fashioned to fit this area 279 implant placed around the globe.  Perforation site chosen at 10 o'clock.  A moderate amount of clear thick  colorless subretinal fluid came forth. Perfluoron propane 100% 0.6 cc was  injected into the vitreous cavity through the pars plana to reinflate the  globe.  240 band was placed around the eye with a belt loop at 2, 4 and 7  o'clock, 270 sleeve placed at 2 o'clock.  The buckle elements were placed  and the scleral flaps were closed with 4-0 Mersilene suture. Indirect  ophthalmoscopy showed the retina be lying nicely on the scleral buckle with  the break well-supported.  The indirect ophthalmoscope laser was moved into  place, 441 burns placed on this scleral buckle around the break.  Power was  between 400 and 600 milliwatts, 1000 microns each, 0.05-0.10 seconds each.  The band was adjusted and trimmed. The buckle was adjusted and trimmed.  Sutures were knotted and  trimmed. The conjunctiva was reposited with 7-0  chromic suture. Polymyxin and gentamicin were irrigated into Tenon's space.  Atropine solution was applied. Marcaine was injected around the globe for  postop pain. TobraDex ophthalmic ointment was placed as well as Polysporin,  patch and shield, TobraDex ophthalmic ointment, a patch and shield. The  closing pressure was 10 with a Barraquer tonometer. Complications none.  Duration one and a half hours.  The patient is awakened, taken to recovery  in satisfactory condition.      Beulah Gandy. Ashley Royalty, M.D.  Electronically Signed     JDM/MEDQ  D:  11/05/2005  T:  11/06/2005  Job:  [id number]

## 2011-02-08 NOTE — H&P (Signed)
NAME:  Judy Ramirez, Judy Ramirez                        ACCOUNT NO.:  [id number]   MEDICAL RECORD NO.:  [id number]                   PATIENT TYPE:  INP   LOCATION:  NA                                   FACILITY:  [facility]   PHYSICIAN:  Judy Ramirez, M.D.                 DATE OF BIRTH:  02-01-1932   DATE OF ADMISSION:  03/05/2004  DATE OF DISCHARGE:                                HISTORY & PHYSICAL   DATE OF OFFICE VISIT/HISTORY & PHYSICAL:  February 23, 2004.   CHIEF COMPLAINT:  Left hip pain.   HISTORY OF PRESENT ILLNESS:  Judy Ramirez is a 77 year old female with an  increasing history of severe pain in the left hip and groin area. The pain  has progressively gotten worse over the past several months. This year she  has had difficulty walking. She had back surgery back in August of last  year. She initially did very well, but subsequently developed drainage and a  staph infection requiring IV antibiotics. This held up fine. During the  course of her recovery the hip kept getting worse. It did occur prior to her  having her back surgery, but it has gotten much worse in the interim. She is  at a point where she can barely walk, having pain day and night. She is seen  in the office where x-rays show that the hip has progressed to severe end  stage to nearly completely bone-on-bone. It is felt that she would benefit  from undergoing total hip replacement. The risks and benefits of the  procedure have been discussed with the patient and she elects to proceed  with surgery.   ALLERGIES:  No known drug allergies.   CURRENT MEDICATIONS:  1. L-thyroxine 137 mcg.  2. Furosemide 40 mg.  3. Potassium chloride XL 20 mEq.  4. Vitamin supplement.  5. Doxazosin.  6. Accupril 40 mg.  7. Voltaren 100 mg.  8. Diltiazem XR 300 mg.  9. Calcium plus D 500 mg.  10.      Stool softener.  11.      Prednisone 5 mg.  12.      Detrol LA 4 mg.   PAST MEDICAL HISTORY:  1. History of bronchitis.  2. History of  pneumonia.  3. Hypertension.  4. Obesity.  5. Polymyalgia rheumatica.  6. Hypothyroidism.  7. Surgical postmenopausal.  8. History of uterine cancer.  9. Recent history of staph infection with previous back surgery.   PAST SURGICAL HISTORY:  1. Hysterectomy.  2. Hemorrhoidectomy.  3. Lumbar surgery.   SOCIAL HISTORY:  Widower, retired. Nonsmoker, no alcohol. She has three  children. Her daughter will be assisting with care after surgery.   FAMILY HISTORY:  Significant for lung cancer with her father deceased at age  55.   REVIEW OF SYSTEMS:  GENERAL: No fever, chills, nightsweats. NEUROLOGIC: No  seizure, syncope, or paralysis. RESPIRATORY:  No shortness  of breath,  productive cough, or hemoptysis. CARDIOVASCULAR: No chest pain, angina, or  orthopnea. GI: No nausea, vomiting, diarrhea, or constipation. GU: She does  have a little bit of urinary frequency and nocturia. She is currently on  Detrol LA. MUSCULOSKELETAL: Pertinent to that of the hip found in the  history of present illness.   PHYSICAL EXAMINATION:  VITAL SIGNS: Pulse 72, respirations 14, blood  pressure 156/82.  GENERAL: A 77 year old white female, well-nourished, well-developed, in no  acute distress, obese, alert, oriented, and cooperative. She is very  pleasant at the time of exam.  HEENT:  Normocephalic and atraumatic. Pupils are round and reactive.  Oropharynx is clear. EOMs are intact.  NECK: Supple.  CHEST: Clear to auscultation in the anterior and posterior chest wall.  HEART: Regular rate and rhythm.  No murmurs.  ABDOMEN: Soft, round, protuberant abdomen. Bowel sounds are present.  RECTAL/BREASTS/GENITALIA: Not done; not pertinent to the present illness.  EXTREMITIES: Significant to that of the left hip. The left hip shows flexion  to 90 degrees. There is no internal rotation, only 5 degrees of external  rotation, and 10 degrees of abduction. He does ambulate with an antalgic  gait.   IMPRESSION:   1. Osteoarthritis, left hip.  2. History of bronchitis.  3. History of pneumonia.  4. Hypertension.  5. Hypothyroidism.  6. Surgical menopause.  7. Obesity.  8. Polymyalgia rheumatica.  9. History of staphylococcal infection with previous lumbar surgery.   PLAN:  The patient will be admitted to [facility] and undergo  right total hip replacement arthroplasty. Surgery will be performed by Dr.  Ollen Ramirez.     Judy Ramirez, P.A.              Judy Ramirez, M.D.    ALP/MEDQ  D:  02/27/2004  T:  02/27/2004  Job:  [id number]   cc:   Judy Fendt, MD  [facility]

## 2011-02-08 NOTE — H&P (Signed)
   NAME:  Judy Ramirez, Judy Ramirez                        ACCOUNT NO.:  [id number]   MEDICAL RECORD NO.:  [id number]                   PATIENT TYPE:  INP   LOCATION:  3022                                 FACILITY:  [facility]   PHYSICIAN:  Cristi Loron, M.D.            DATE OF BIRTH:  01/11/1932   DATE OF ADMISSION:  05/31/2003  DATE OF DISCHARGE:                                HISTORY & PHYSICAL   HISTORY OF PRESENT ILLNESS:  I had performed an L3-L5 laminectomy on her as  well as L5 fusion with instrumentation on April 27, 2003.  The surgery went  well.  She did develop postoperative pneumonia which was treated with  antibiotics with p.o. Biaxin.  She was discharged and did quite well until  three days prior to admission when she developed some discharge over the  wound.  She did not notice any fevers or discharge prior to this.  She came  to postoperative follow-up appointment on May 31, 2003 and I felt the  wound was infected and I admitted her for cultures as well as antibiotics  and for incision and drainage of the wound.   PHYSICAL EXAMINATION:  SKIN:  Physical exam has not changed as documented  from previous admission except that her wound demonstrates erythema.  There  is a small dehiscence and she has some purulent discharge.  NEUROLOGICAL:  Demonstrates normal motor strength and sensation.   ASSESSMENT/PLAN:  Wound infection:  The patient is going to be admitted for  blood cultures, CBC, sedimentation rate, etc., as well as wound cultures.  After we get the cultures, we are going to start her empiric antibiotics and  we will schedule her for incision and drainage of the wound.  I explained  the surgery, risks, benefits, and alternatives.  The patient and her family  have consented.                                                  Cristi Loron, M.D.    JDJ/MEDQ  D:  06/01/2003  T:  06/02/2003  Job:  [id number]

## 2011-02-08 NOTE — H&P (Signed)
NAME:  Judy Ramirez, Judy Ramirez                        ACCOUNT NO.:  [id number]   MEDICAL RECORD NO.:  [id number]                   PATIENT TYPE:  INP   LOCATION:  0451                                 FACILITY:  [facility]   PHYSICIAN:  Madlyn Frankel. Charlann Boxer, M.D.               DATE OF BIRTH:  10-25-1931   DATE OF ADMISSION:  04/17/2004  DATE OF DISCHARGE:                                HISTORY & PHYSICAL   CHIEF COMPLAINT:  Draining left hip wound.   HISTORY OF PRESENT ILLNESS:  The patient is a 77 year old female well known  to Dr. Ollen Gross, who was brought in to the emergency department earlier  this morning for complaints over the past 24 to 48 hours.  She is well known  to have a left total hip replacement approximately 6 weeks ago.  She has had  a relative uneventful course up until the past 2 days.  She had an eschar  over part of the incision which came off on Sunday and started draining.  She was out and about on Monday and actually did well, but Monday evening  she started to develop some chills and had at least 1 episode of nausea and  vomiting.  She comes in today feeling very weak and ill, and also feeling  feverish.  She comes in for evaluation for possible infection.  She was seen  and evaluated and admitted for surgical intervention.   ALLERGIES:  No known drug allergies.   CURRENT MEDICATIONS:  1. L-thyroxine 137 mcg daily.  2. Furosemide 40 mg daily.  3. Potassium 20 mEq daily.  4. Doxazosin 8 mg daily.  5. Accupril 40 mg daily.  6. Diltiazem XR 300 mg daily.  7. She states that they were tapering off her Prednisone, her most recent     dose just 2 days ago was Prednisone 2.5 mg daily.  8. Voltaren 100 mg (patient told me that she stopped the Detrol LA 4 mg).   PAST MEDICAL HISTORY:  1. Past history of bronchitis.  2. Past history of pneumonia, September, 2004.  3. Hypertension.  4. Obesity.  5. Polymyalgia rheumatica with previous chronic steroid use.  6.  Hypothyroidism.  7. Surgical postmenopausal.  8. History of uterine cancer.  9. Previous history of methicillin sensitive Staphylococcus aureus infection     to lumbar spine in September of 2004.   PAST SURGICAL HISTORY:  Lumbar laminectomy infusion, October, 2004 (which  subsequently became infected the following month).  Hysterectomy.  Hemorrhoidectomy.  Most recent left total hip replacement arthroplasty in June of 2005.   SOCIAL HISTORY:  Widowed, retired.  Non smoker.  No alcohol.  Has 3  children.   FAMILY HISTORY:  Significant for lung cancer in her father who died at age  17.   REVIEW OF SYSTEMS:  GENERAL:  She has had some chills over the past night.  Her only fever is today with  a noted temperature of 102.8.  NEURO:  No  seizures, syncope, paralysis.  RESPIRATORY:  No shortness of breath,  productive cough, hemoptysis.  CARDIOVASCULAR:  No chest pain, angina,  orthopnea.  GI:  She did have an episode of nausea and vomiting last night,  none at this time.  No diarrhea, no constipation.  GU:  No dysuria, no  hematuria or discharge.  MUSCULOSKELETAL:  Pertinent for the left hip in the  history of present illness.   PHYSICAL EXAMINATION:  VITAL SIGNS:  Temperature 102.8, pulse 100,  respirations 22, blood pressure 198/82.  GENERAL:  This is a 77 year old  white female, well-developed, well-nourished, overweight, who does appear to  be somewhat ill-appearing.  She is alert and oriented and cooperative.  She  is accompanied by her family.  HEENT:  Normocephalic, atraumatic.  Pupils are equal, round and reactive to  light.  Oropharynx clear.  Ears are intact.  CHEST:  Clear anterior, posterior chest walls.  HEART:  Regular rate and rhythm, no murmurs.  ABDOMEN:  Soft, round, protuberant. Bowel sounds present.  RECTAL/GENITALIA:  Not done, not pertinent for this illness.  EXTREMITIES:  Exam limited to the left lower extremity; she does have good  passive range of motion of  the left hip, however, she does have a little bit  of discomfort on extreme flexion.  LEFT HIP INCISION:  She does have a small area approximately 2.5 to 3 cm  long which does appear where an eschar has come off.  There is no obvious  drainage, only able to express a very scant amount of serous drainage, no  purulence to the drainage at this time.  The tissues in this area  surrounding the proximal incision and lateral hip region do appear  erythematous, warm and indurated.   IMPRESSION:  1. Infected left total hip.  2. Status post left total hip replacement arthroplasty in June, 2005.  3. History of bronchitis.  4. History of pneumonia, September, 2004.  5. Hypertension.  6. Hypothyroidism.  7. Surgical postmenopausal.  8. Obesity.  9. Polymyalgia rheumatica with a history of chronic steroid use.  10.      History of methicillin sensitive Staphylococcus aureus infection,     September, 2004 after lumbar surgery.   PLAN:  The patient will be admitted to [facility] to undergo  incision and drainage of the left hip incision with possible exchange of  bearing surfaces.  Surgery will be performed by Dr. Durene Romans who will be  the admitting attending.     Alexzandrew L. Julien Girt, P.A.              Madlyn Frankel Charlann Boxer, M.D.    ALP/MEDQ  D:  04/18/2004  T:  04/18/2004  Job:  [id number]   cc:   Maxwell Caul, M.D.  [address]  [place]  [place] [address]  Fax: [phone]   Rosanne Sack, M.D.  [address]  [place], [place] [address]  Fax: [phone]   Lacretia Leigh. Ninetta Lights, M.D.  [address]  [place]  [place] [address]  Fax: [phone]   Erle Crocker, M.D.  [facility]   Ollen Gross, M.D.  [facility]  [address]  [address]  [place]  [place] [address]  Fax: [phone]

## 2011-02-08 NOTE — Discharge Summary (Signed)
NAME:  Judy Ramirez, Judy Ramirez                        ACCOUNT NO.:  [id number]   MEDICAL RECORD NO.:  [id number]                   PATIENT TYPE:  INP   LOCATION:  3005                                 FACILITY:  [facility]   PHYSICIAN:  Cristi Loron, M.D.            DATE OF BIRTH:  [birth year]   DATE OF ADMISSION:  04/27/2003  DATE OF DISCHARGE:  05/05/2003                                 DISCHARGE SUMMARY   For full details of this admission, please refer to typed history and  physical.   BRIEF HISTORY:  The patient is a 77 year old white female who suffers from  intractable back and bilateral leg pain consistent with neurogenic  claudication.  She was working with a lumbar MRI and a lumbar myelo CT which  demonstrates lumbar spinal stenosis at L1-2, L2-3, L3-4, L4-5 and L5-S1 as  well as degenerative disease at these levels.  The patient had  spondylolisthesis at L4-5 as well.  I discussed the various treatment option  with her including surgery.  The patient has weighed the risks, benefits and  alternatives to surgery, so I proceeded with a lumbar decompression and  fusion.  For further details of this admission, please refer to the history  and physical.   HOSPITAL COURSE:  I admitted the patient the [facility]  on April 27, 2003.  The day of admission I performed L4 Gill procedure with L4-5  fusion and laminectomy from L2 to L5.  The surgery went well without  complications (for full details of this operation, please refer to typed  operative note).  Postoperative course:  The patient's postoperative course  was remarkable only for she has developed a fever on postoperative day #3.  We worked this up with UA, CBC, blood cultures, etc., which were all okay  but we got a chest x-ray which demonstrated pneumonia.  The patient was  started on empiric Biaxin and she  subsequently became afebrile.  During  this hospitalization we had physical therapy and occupational therapy  see  her as well as the rehab team.  They did not think she needed any inpatient  rehab.   By May 05, 2003, the patient was afebrile.  Vital signs stable.  She was  eating well, ambulating well.  Her wound was healing well without signs of  infection and she was requesting discharge home.  She was, therefore,  discharged on May 05, 2003 with arrangements for home physical therapy  and occupational therapy.   DISCHARGE INSTRUCTIONS:  The patient was given written discharge  instructions.  Instructed to follow up with me in four weeks and to resume  her outpatient medical regimen.   DISCHARGE MEDICATIONS:  1. Biaxin 500 mg, #14, one p.o. b.i.d.  2. Valium 5 mg, #50, one p.o. q.6h. p.r.n. for muscle spasm.  3. Percocet 5 mg, #100, one to two p.o. q.4h. p.r.n. for pain.   FINAL DIAGNOSES:  1. L1-2, L2-3, L3-4, L4-5, and L5-S1 spinal stenosis and degenerative     disease.  2. L4-5 spondylolisthesis.  3. Lumbar radiculopathy.  4. Lumbago.  5. Pneumonia.   PROCEDURE PERFORMED:  1. L4 Gill procedure.  2. L4-5 posterior lumbar interbody fusion.  3. Insertion of bilateral L4-5 interbody prosthesis (Tangent 10 x 26 mm bone     prosthesis).  4. Posterolateral segment amputation L4-5 with M-10 titanium pedicle screws     and rods.  5. Posterolateral arthrodesis at L4-5 with local morselized autograft bone     and ____________ bone scaffolding.  6. Bilateral extensive laminectomy, foraminotomy at L2-3 and L5 to treat the     patient's spinal stenosis at those levels.                                                Cristi Loron, M.D.    JDJ/MEDQ  D:  05/19/2003  T:  05/20/2003  Job:  [id number]

## 2011-02-08 NOTE — Op Note (Signed)
NAME:  ADYLYNN, HERTENSTEIN                        ACCOUNT NO.:  [id number]   MEDICAL RECORD NO.:  [id number]                   PATIENT TYPE:  INP   LOCATION:  0009                                 FACILITY:  [facility]   PHYSICIAN:  Ollen Gross, M.D.                 DATE OF BIRTH:  [birth year]   DATE OF PROCEDURE:  03/05/2004  DATE OF DISCHARGE:                                 OPERATIVE REPORT   PREOPERATIVE DIAGNOSES:  Osteoarthritis of the left hip.   POSTOPERATIVE DIAGNOSES:  Osteoarthritis of the left hip.   PROCEDURE:  Left total hip arthroplasty.   SURGEON:  Ollen Gross, M.D.   ASSISTANT:  Avel Peace, P.A.-C.   ANESTHESIA:  General.   ESTIMATED BLOOD LOSS:  500   DRAINS:  Hemovac x1.   COMPLICATIONS:  None.   CONDITION:  Stable to recovery.   BRIEF CLINICAL NOTE:  Ms. Callicott is a 77 year old female with end-stage  arthritis of the left hip with pain refractory to nonoperative management.  She presents now for left total hip arthroplasty.   DESCRIPTION OF PROCEDURE:  After successful administration of general  anesthetic, the patient was placed in the right lateral decubitus position  with the left side up and held with a hip positioner.  The left lower  extremity was isolated from her perineum with plastic drapes and prepped and  draped in the usual sterile fashion. A long posterolateral incision was  utilized secondary to her morbid obesity. We cut the skin with a 10 blade  through an approximately 8 inch layer of subcutaneous tissue down to the  fascia lata.  The fascia lata is incised in line with the skin incision and  the sciatic nerve palpated and protected.  Short external rotators are  isolated off the femur and capsulectomy performed. The hip is dislocated and  the center of the femoral head marked. Trial prosthesis placed such that the  center of the trial head corresponds to the center of the native femoral  head. The osteotomy line is marked on the  femoral neck and osteotomy is made  with an oscillating saw.  The femur is then retracted anteriorly to gain  acetabular exposure.  The acetabular labrum and osteophytes are removed.  Reaming starts at a 47 coursing in increments of 2 to a 55 and then a 56 mm  pinnacle acetabular shell is placed into anatomic position and transfixed  with two dome screws.  A trial 33 mm neutral plus 4 liner is placed.   Femoral preparation begins with the canal finder and then irrigation.  Axial  reaming was performed to 13.5 mm, proximal reaming to an 62F and the sleeve  machined to a large. An 62F large trial sleeve is placed and 18 x 13 stem,  36 plus 8 neck matching her native anteversion.  A 32 plus zero head is  placed but there was some soft tissue laxity  with the plus zero so we went  to a plus 6.  Reduction is performed with excellent stability, full  extension and full external rotation, 70 degrees flexion, 40 degrees  adduction, 90 degrees internal rotation and 90 degrees flexion, 70 degrees  internal rotation. By placing the left leg on top of the right, the leg  lengths are equal.  The hip is then dislocated and all trials removed. The  permanent apex hole eliminator and permanent 32 mm neutral plus 4 marathon  liner is placed into the acetabular shell. The 99F large sleeve is placed  and 18 x 13 stem and a 36 plus 8 neck matching her native anteversion. A 32  plus 6 head is placed and the hip is reduced with the same stability  parameters. The wound is copiously irrigated with antibiotic solution and  the short rotator is reattached to the femur through drill holes.  The  fascia lata is closed over a Hemovac drain with interrupted #1 Vicryl and  subcu closed in approximately five layers with three layers of #1 Vicryl,  two layers of 2-0 Vicryl and then the subcuticular stitch is a running 4-0  Monocryl.  30 mL of 0.25% Marcaine with epinephrine is injected into the  subcu tissues and then  Steri-Strips and a bulky sterile dressing are  applied. A drain is hooked to suction and she is awakened and transported to  recovery in stable condition.                                               Ollen Gross, M.D.    FA/MEDQ  D:  03/05/2004  T:  03/05/2004  Job:  [id number]

## 2011-02-08 NOTE — Discharge Summary (Signed)
NAME:  Judy Ramirez, Judy Ramirez                        ACCOUNT NO.:  [id number]   MEDICAL RECORD NO.:  [id number]                   PATIENT TYPE:  INP   LOCATION:  3002                                 FACILITY:  [facility]   PHYSICIAN:  Rosanne Sack, M.D.         DATE OF BIRTH:  Feb 12, 1932   DATE OF ADMISSION:  06/19/2003  DATE OF DISCHARGE:  06/23/2003                                 DISCHARGE SUMMARY   CHIEF COMPLAINT:  Short of breath.   DISCHARGE DIAGNOSES:  1. Right middle lobe and right lower lobe community acquired pneumonia,     improving. Current antibiotic therapy Rocephin, converted to Ceftin to     complete a total 14 day course post discharge.     a. CT scan of the chest and lower extremities per deep venous        thrombosis/pulmonary embolism protocol found poor contrast opacity        with no pulmonary emboli suspected in the main pulmonary arteries with        first order branches, but more peripheral vessels could not be        evaluated with confidence. Prominence in pulmonary interstitium        raising the possibility of early edema. Patchy density right base and        right middle lobe attributed to atelectasis, scar, or minimal patchy        pneumonia. Lower extremity CT negative for deep venous thrombosis.     b. V/Q scan done June 19, 2003 was low probability, less than 10%        risk, of pulmonary embolus.     c. Chest x-ray June 19, 2003 noted suspicious early congestive heart        failure.     d. Chest x-ray June 22, 2003 noted bibasilar atelectasis with a        small right pleural effusion.     e. Stable O2 saturations currently on room air, both resting and        ambulatory. Will not require home oxygen.  2. Uncontrolled hypertension. Somewhat elevated systolic blood pressure at     discharge in the 160 range.     a. Will restart previous Accupril and ask [facility] to        follow blood pressures post discharge.  3. Hypokalemia. Thought secondary to diuretics.     a. Placed on routine potassium supplementation.     b. Should have a follow-up basic metabolic panel next office visit,        approximately one week post discharge at [facility].        Particular attention paid to the potassium level.  4. Hypothyroidism.     a. TSH elevated 6.405 on chronic Synthroid, status post dose increased to        0.137 mg.     b. Should have follow-up serum TSH in  six to eight weeks post discharge.  5. Chronic steroids secondary to polymyalgia rheumatica with mild Addisonian     crisis.     a. Status post stress dosing. Will need steroid taper back to baseline        post discharge.  6. Methicillin resistant staphylococcus aureus.     a. Lumbar back surgery, Dr. Lovell Sheehan, April 27, 2003 including extensive        laminectomy, foraminotomy L2, L3, L4. for treatment of spinal stenosis        at those levels.     b. Methicillin sensitive staphylococcus aureus wound infection to the        back incision with incision and drainage, Dr. Lovell Sheehan, June 01, 2003. Plan 45 days vancomycin therapy and Rocephin oral therapy.        Status post PICC line placement.     c. The patient will continue to come daily to [facility] for IV vancomycin via PICC line and continue her oral        Rifampin, now day 24 of 45.  7. Hyperglycemia secondary to increased oral steroids. Hemoglobin A1C 6.9.     a. Hemoglobin A1C suggestive of possible borderline diabetes. Request        follow-up hemoglobin A1C when the patient is back on her baseline        prednisone dosing.  8. History of polymyalgia rheumatica. Chronic 10 mg daily steroids, though     the patient tells me that this has recently been decreased to 5 mg.     Ultimate baseline steroid dosing per [facility] and Dr.     Phylliss Bob, rheumatology.  9. Osteoarthritis involving multiple joint.   ALLERGIES:  No  known drug allergies.   CODE STATUS:  Currently full code.   DISCHARGE MEDICATIONS:  1. Rifampin 300 mg daily for 21 days post discharge.  2. Cardura 8 mg daily at bedtime.  3. Cardizem CD 300 mg daily.  4. Accupril 40 mg daily.  5. Combivent metered dose inhaler two puffs four times daily for seven to 10     days post discharge.  6. Humibid LA 600 mg give two tablets equally 1200 mg twice daily for seven     days post discharge.  7. Synthroid 0.137 mg daily.  8. Ceftin 250 mg twice daily for eight days post discharge.  9. Lasix 40 mg a.m.  10.      Vancomycin 1500 mg IV daily via PICC line at [facility].  11.      Prednisone 30 mg twice daily for two days, then 30 mg daily for     three days, then 20 mg daily for three days, then 10 mg daily and     continue this indefinitely.  12.      K-Dur 20 mEq daily.  13.      Tylenol over-the-counter for mild pain or fever.   DISCHARGE INSTRUCTIONS:  1. The patient to return home where she lives independently.  2. Activity:  Up as tolerated without restrictions.  3. Follow-up with [facility] one week post discharge. The     patient requested to call for an appointment.  4. The patient should have a repeat basic metabolic panel next office visit     at  [facility] with special attention paid to  serum     potassium on new medications for potassium supplementation.  5. Daily Vancomycin therapy at [facility] outpatient with PICC line care     per their protocol.  6. Repeat serum TSH six to eight weeks post discharge, status post dose     increase of Synthroid.  7. The patient's ambulatory O2 saturations now in the 90s. Will not require     home oxygen.   CONSULTATIONS:  Physical and occupational therapy.   PROCEDURES:  1. CT scan of the chest and lower extremity June 19, 2003 results as     above.  2. VQ scan June 19, 2003 results as  above.  DISCHARGE LABORATORIES:  A basic metabolic panel done June 22, 2003  found sodium 141, potassium 3.2, chloride 102, CO2 31, BUN 10, and  creatinine 0.8. A CBC done June 22, 2003 WBC 12.2, hemoglobin 10.2,  hematocrit 31.7, platelets 322. Coags PT 14.2, INR 1.1, PTT 34. Serum TSH  initially 5.946 and follow-up 6.405. Cardiac enzymes on admission  unremarkable. Radiology as reported above.   DIET:  Low-sodium. The patient also requested to avoid concentrated sweets  until her prednisone dosing is back to baseline 10 mg.   For dictated admission history of present illness, admission medications,  family history, social history, review of systems, physical exam, laboratory  data, impressions and plan, please see the dictated admission history and  physical dated June 19, 2003.   HOSPITAL COURSE:  Problem 1.  Cough and shortness of breath. This 77-year-  old female is followed in primary care at [facility], I  believe Dr. Arlyce Dice. She has a history of recent hospitalization status post  lumbosacral back surgery April 27, 2003 with an infected wound incision,  methicillin resistant staphylococcus aureus. Following hospitalization a  PICC line was placed and the patient is on extended term 45 day antibiotics  therapy with Vancomycin and Rifampin. She is also on chronic prednisone 5-10  mg for polymyalgia rheumatica. She denied any leg pain with a prior history  of DVT, but did complain of shortness of breath. She was sent to [facility] for further evaluation and treatment and was admitted at that time.  Questionable DVT or pulmonary emboli and a CT scan of the chest and lower  extremity was done. This found no evidence of pulmonary emboli in the large  to intermediate vessels, though smaller vessels could not be visualized.  There was no DVT per lower extremity CT Scan. The CT scan of the chest did  suggest atelectasis or early pneumonia in the  right mid and lower lung. She  had a VQ scan obtained just to further evaluated for possible pulmonary  emboli. This showed low probability, less than 10% of pulmonary emboli. The  patient was felt to have community acquired pneumonia. She was started on  Rocephin antibiotic therapy, frequent nebulizers, supportive nasal cannula  oxygen, also stress dosed her steroids as below. With this combination of  therapy she improved over the remainder of the hospitalization. She was  switched from IV Rocephin to oral Ceftin and has remained stable on this.  She will continue a course of Ceftin post discharge to complete a 14 day  course. We will also continue Humibid LA and Combivent MDI post discharge  until antibiotics are completed. She had some hypoxia with ambulation down  to 85% on room air, but prior to discharge this has improved to the 90s per  pulse oximetry. I do  not believe she will require home oxygen support.   Problem 2.  Uncontrolled hypertension. Systolic blood pressures somewhat elevated towards the end of hospitalizations in the 160-180 range. The  patient was briefly increased on her dose of diuretic Lasix from 40-60 mg  with some improvement. I note that she was previously on Accupril and I will  decrease her Lasix back to baseline 40 mg dosing and continue Accupril post  discharge. We ask that [facility] follow her blood  pressures post discharge next office visit to insure that these are  normalized.   Problem 3.  Hypokalemia. The patient again is on diuretics. Her serum  potassium was mildly low at 3.2 on admission. She was added potassium  supplementation and per the last labs still somewhat subnormal at 3.2  potassium. Continue potassium supplementation post discharge, but her serum  potassium should be followed closely status post restart of her Accupril.  Would recommend basic metabolic panel one week post discharge.   Problem 4.  Hypothyroidism. The  patient has a known history of  hypothyroidism. She was maintained on 0.125 mg of Synthroid. Her TSH was  somewhat elevated 6.405. Her Synthroid dose was increased to 0.137 mg daily  and she should have a follow-up TSH level six to eight weeks post discharge.   Problem 5.  Chronic steroids with mild Addisonian crisis. The patient felt  weak on admission and it is felt that she may have had a mild Addisonian  crisis due to stress of methicillin sensitive staph infection pneumonia. She  was stress dosed on her steroids and she feels improved. I am in the process  of tapering her steroids now and this will continue post discharge back to  her baseline 10 mg daily. Any further tapering of that dose per [facility] and Dr. Phylliss Bob, rheumatology.   Problem 6.  Methicillin sensitive staphylococcus aureus. This is a history  as described above. The patient now day 24 of 45 total therapy. She  continued her Vancomycin via PICC line and Rifampin orally over the course  of hospitalization. The plan is to continue her Vancomycin at [facility]  day hospital as had been done previously.   Problem 7.  Hyperglycemia. Noticed some elevated serum glucose last check  184. This likely secondary to increased steroid dosing. A hemoglobin A1C was  checked and it was 6.8. This suggests possible borderline diabetes. What I  would suggest is decreasing the patient back to her baseline prednisone  dosing and then recheck a hemoglobin A1C at some point as she may well have  some borderline diabetes here.   DISPOSITION:  The patient discharged home where she lives independently in  the [place] area. Follow-up [facility] as above.   ACTIVITY:  No restriction.   DIET:  Low-sodium and no concentrated sweets until back to baseline  prednisone dosing.   CONDITION ON DISCHARGE:  Stable/improved with multiple medical problems as  above.   DISCHARGE PROCESS:  Greater than 30  minutes 11:30 a.m. through 12:30 p.m.     Everett Graff, N.P.                     Rosanne Sack, M.D.    TC/MEDQ  D:  06/23/2003  T:  06/23/2003  Job:  [id number]   cc:   [facility]

## 2011-02-08 NOTE — Op Note (Signed)
NAME:  Judy Ramirez, Judy Ramirez                        ACCOUNT NO.:  [id number]   MEDICAL RECORD NO.:  [id number]                   PATIENT TYPE:  INP   LOCATION:  0451                                 FACILITY:  [facility]   PHYSICIAN:  Madlyn Frankel. Charlann Boxer, M.D.               DATE OF BIRTH:  [birth year]   DATE OF PROCEDURE:  04/17/2004  DATE OF DISCHARGE:                                 OPERATIVE REPORT   PREOPERATIVE DIAGNOSIS:  Infected left total hip replacement.   POSTOPERATIVE DIAGNOSIS/FINDINGS:  Deep infection, left total hip  replacement.   PROCEDURES:  1. Irrigation and debridement, skin, subcutaneous tissue, muscle, fat, and     bone.  2. Replacement arthroplasty with exchange of polyethylene liner, femoral     stem, and femoral head.  3. Irrigation of hip with 12 L of normal saline solution with pulse lavage     hip system.  4. Obtaining of cultures.   SURGEON:  Madlyn Frankel. Charlann Boxer, M.D.   ASSISTANT:  Alexzandrew L. Julien Girt, P.A.   ANESTHESIA:  General.   ESTIMATED BLOOD LOSS:  400.   DRAINS:  One superficial drain and one deep drain.   COMPLICATIONS:  None apparent.   INDICATION FOR PROCEDURE:  Ms. Clites is a very pleasant 77 year old  female who was brought to the emergency room with a less than 24-hour  progressive worsening in symptoms.  She is status post a left total hip  replacement approximately six weeks ago.  She was in her normal state of  health until over the past 24 hours, when she began to feel symptomatic.  No  temperature was ever taken at home; however, on presentation she had a fever  over 102.  She began to feel very sick and nauseous.  This was also  associated with some draining in the subcutaneous tissue, which subsequently  was the main reason for the referral to the emergency department.   Upon evaluation she had areas of wound breakdown and drainage to the  posterior aspect of the incision.  Based on her presentation, fever, and  drainage, she was  set up to take to the operating room immediately this day  for at least a superficial I&D of the wound and evaluation of wound tract.  After reviewing risks and benefits of the procedure, including the potential  for exchange of modular components and I&D of hip, she was taken to the  operating room.   PROCEDURE IN DETAIL:  The patient was brought to the operative theatre.  Once adequate anesthesia was established, the patient was positioned in the  right lateral decubitus position with her left side up.  Note that prior to  our evaluation in the emergency room, the emergency room physicians had  given her a dose of Rocephin.  Antibiotics were otherwise held.  Note too  that she had had a history of a MRSA infection following lumbar fusion over  a  year ago.  The patient's left lower extremity was then prepped and draped  in a sterile fashion.  The patient's previous incision was excised,  including the area of macerated and indurated scar at the site of drainage.  It was immediately evident at that point that a large amount of purulent  fluid was present.  Cultures were obtained from this fluid.  Further  dissection and debridement of subcutaneous fat down to the level of the  iliotibial band revealed the fact that this fascial level had been severely  compromised with a 5 cm defect in the fascia.  At this point it was clearly  evident that this would involve the joint space as it was directly palpable  through this defect.  At this point arrangements were made for changing  modular components.  Further debridement of subcutaneous fat and muscle  tissue was carried out.  The hip was dislocated, the modular head was  removed, and the modular S-ROM stem was removed, and the modular  polyethylene liner was removed from the acetabulum.  Significant I&D of the  hip was carried out, irrigating the hip at this point with about 10-11 L of  normal saline solution with pulse lavage.  Soft tissue  cultures were  obtained and sent to pathology.  Following the I&D, the new components were  placed.  This included a 32 +4 neutral liner, a 32 +6 S-ROM metal ball, and  an 18-13, 36 +8 lateral offset S-ROM stem, which was placed in the same  version as when it was removed.  The hip was reduced.  The hip was found to  be extremely stable in all parameters, including flexion to 80 degrees,  internal rotation to 80 degrees, extension and external rotation as well as  in position.  At this point the hip was further irrigated.  A deep drain was  placed beneath the iliotibial band.  The iliotibial band and gluteus fascia  were reapproximated using a #1 PDS.  The significant layer of subcutaneous  fat later was then reapproximated in several layers with a superficial drain  placed.  The subcutaneous layer was loosely reapproximated and 2-0 nylon was  used to reapproximate the skin.  Following this the hip was cleaned and  dried and dressed sterilely with dressing sponges and tape.  The patient was  extubated, transferred to the recovery room in stable condition.   Consults will be made for Juan-Carlos Monguilod, M.D., as well as for  infectious disease for help in management of her medical conditions as well  as help for management of the infection.  At this point she will be placed  on vancomycin.  Note that she did receive 2 g of vancomycin perioperatively.                                               Madlyn Frankel Charlann Boxer, M.D.    MDO/MEDQ  D:  04/18/2004  T:  04/18/2004  Job:  [id number]

## 2011-02-08 NOTE — Op Note (Signed)
NAME:  Judy Ramirez, Judy Ramirez                        ACCOUNT NO.:  [id number]   MEDICAL RECORD NO.:  [id number]                   PATIENT TYPE:  INP   LOCATION:  3022                                 FACILITY:  [facility]   PHYSICIAN:  Cristi Loron, M.D.            DATE OF BIRTH:  1932/03/25   DATE OF PROCEDURE:  06/01/2003  DATE OF DISCHARGE:                                 OPERATIVE REPORT   PREOPERATIVE DIAGNOSIS:  Wound infection.   POSTOPERATIVE DIAGNOSIS:  Wound infection.   PROCEDURE:  Incision and drainage of wound.   SURGEON:  Cristi Loron, M.D.   ANESTHESIA:  General endotracheal.   ESTIMATED BLOOD LOSS:  50 mL.   SPECIMENS:  Wound cultures.   DRAINS:  None.   COMPLICATIONS:  None.   BRIEF HISTORY:  The patient is a 77 year old white female on whom I  performed an L3 to L5 laminectomy with L5 fusion and instrumentation back on  April 27, 2003.  She did well postoperatively except she did develop a  nosocomial pneumonia, which was treated with Biaxin.  She came back for her  one-month postoperative check and had developed some discharge from the  wound consistent with an infection.  I recommended surgery.  The patient  weighed the risks, benefits, and alternatives of surgery and decided to  proceed with the operation.   DESCRIPTION OF PROCEDURE:  The patient was brought to the operating room by  the anesthesia team, and general endotracheal anesthesia was induced.  The  patient was turned to the prone position on the Wilson frame.  Her  lumbosacral region was then prepared with Betadine scrub with Betadine  solution.  Sterile drapes were applied.  I then used a scalpel to incise the  patient's fresh lumbar incision.  There was some straw-colored fluid in the  subcutaneous space.  I did not see any clearly purulent material, i.e., I  did not see any pus.  This subcutaneous cavity went down as far as the  thoracolumbar fascia.  I irrigated out copiously with  bacitracin solution.  I debrided the wound edges.  I could not see any communication down through  the paraspinous musculature into the epidural space.  I obtained several  cultures for both anaerobic and aerobic cultures, then obtained hemostasis  using electrocautery, and then reapproximated the patient's subcutaneous  tissue with interrupted #1 and 2-0 Vicryl sutures and the skin with a  running 3-0 nylon suture.  The wound was then coated with bacitracin  ointment, a sterile  dressing was applied, the drapes were removed.  The patient was subsequently  returned to a supine position, where she was extubated by the anesthesia  team and transported to the postanesthesia care unit in stable condition.  All sponge, instrument, and needle counts were correct at the end of this  case.  Cristi Loron, M.D.    JDJ/MEDQ  D:  06/01/2003  T:  06/02/2003  Job:  [id number]

## 2011-02-08 NOTE — Op Note (Signed)
NAME:  Judy Ramirez, Judy Ramirez                        ACCOUNT NO.:  [id number]   MEDICAL RECORD NO.:  [id number]                   PATIENT TYPE:  INP   LOCATION:  0451                                 FACILITY:  [facility]   PHYSICIAN:  Ollen Gross, M.D.                 DATE OF BIRTH:  04/19/1932   DATE OF PROCEDURE:  04/25/2004  DATE OF DISCHARGE:                                 OPERATIVE REPORT   PREOPERATIVE DIAGNOSIS:  Seroma, left hip.   POSTOPERATIVE DIAGNOSIS:  Seroma, left hip.   PROCEDURE:  Irrigation and debridement of left hip.   SURGEON:  Gus Rankin. Aluisio, M.D.   ASSISTANT:  Avel Peace, PA-C   ANESTHESIA:  General.   ESTIMATED BLOOD LOSS:  Minimal.   DRAIN:  Blake drain x 1 sewn in place.   COMPLICATIONS:  None.   CONDITION:  Stable to recovery.   BRIEF CLINICAL NOTE:  [name] is a 77 year old female, who had a left  total hip arthroplasty done approximately 8 weeks ago with initial excellent  result.  She presented with an acute infection a week ago and was treated  with irrigation and debridement by Dr. Charlann Boxer as well as exchange of the  bearing surface.  She has done very well and has been afebrile with minimal  discomfort.  She has had draining in the wound, consistent with a probably  underlying seroma.  She presents now for irrigation and debridement.   PROCEDURE IN DETAIL:  After the successful administration of general  anesthetic, the patient is placed in the right lateral decubitus position  with the left side up and held with the hip positioner.  Left hip and thigh  are prepped and draped in the usual sterile fashion.  Prior to prepping and  draping, I removed the sutures from the incision.  As soon as I opened the  incision, there was a significant amount of hematoma-type fluid present.  We  sent this for culture.  I evacuated the fluid, and underlying tissue  appeared very healthy.  This was all isolated to the subcutaneous layer  above the fascia.   No holes in the fascia were noted.  We debrided any  abnormal-appearing fatty tissue and thoroughly irrigated the wound with 3 L  of saline with pulsatile lavage, then another L with antibiotic solution and  an additional 3 L of pulsatile lavage.  I made a tiny incision in the fascia  and palpated and did not encounter any fluid.  I also placed a suction into  the joint and did not encounter any fluid.  We closed that small incision in  the fascia and then placed a Blake drain in the subcutaneous tissue to exit  just anterior to the incision.  The subcu was closed in 4 layers with  interrupted #1 Vicryl, the first 3 layers deep, 2-0 Vicryl for a more  superficial  layer, and then the  skin is closed with staples.  The Blake drain is hooked  to suction, and the drain is sewn into the skin with interrupted 4-0 nylon.  A bulky sterile dressing is applied, and she is awakened and transported to  recovery in stable condition.                                               Ollen Gross, M.D.    FA/MEDQ  D:  04/25/2004  T:  04/26/2004  Job:  [id number]

## 2011-02-08 NOTE — Discharge Summary (Signed)
   NAME:  Judy Ramirez, Judy Ramirez                        ACCOUNT NO.:  [id number]   MEDICAL RECORD NO.:  [id number]                   PATIENT TYPE:  INP   LOCATION:  3022                                 FACILITY:  [facility]   PHYSICIAN:  Cristi Loron, M.D.            DATE OF BIRTH:  Mar 10, 1932   DATE OF ADMISSION:  05/31/2003  DATE OF DISCHARGE:  06/06/2003                                 DISCHARGE SUMMARY   HISTORY OF PRESENT ILLNESS:  Briefly, the patient is a 77 year old white  female who I performed an L3-L5 laminectomy with L4-L5 fusion and  instrumentation back on April 27, 2003.  She did well postoperatively but  did develop a nosocomial pneumonia which was treated with Biaxin.  She came  back for her one month postoperative check and had developed some discharge  from the wound consistent with an infection.  I recommended she be admitted  for culture, antibiotics and surgery.  The patient weighed the risks,  benefits and alternatives to surgery and decided to proceed with the  operation.   For further details of this admission, please refer to typed admission note.   HOSPITAL COURSE:  I admitted the patient to [facility] on  June 01, 2003 with diagnosis of a wound infection.  Her cultures came  back with consistent with a sensitive Staphylococcus Aureus.  I took the  patient to the operating room on June 01, 2003 and performed an incision  and drainage of her wound.  The surgery went well without complications.  For full details of the operation, please refer to the type operative note.  When I discharged the patient we made arrangements for home health  intravenous antibiotics and wound checks and by June 06, 2003 these  arrangements were in place.  The patient was requesting discharge home and  was discharged home on June 06, 2003.   DISCHARGE INSTRUCTIONS:  The patient was given written discharge  instructions and instructed to follow up  with me in one week for suture  removal.   DISCHARGE MEDICATIONS:  1. Rifampin 300 mg, #30, one p.o. daily.  2. Percocet 5, #60, one to two p.o. q.4h. PRN for pain with no refills.  3. Cefazolin 1 gram intravenously q.8h. for 42 days.   FINAL DIAGNOSIS:  Staphylococcus Aureus wound infection.   PROCEDURE PERFORMED:  Incision and drainage of wound.                                                Cristi Loron, M.D.    JDJ/MEDQ  D:  06/06/2003  T:  06/06/2003  Job:  [id number]

## 2011-02-08 NOTE — Discharge Summary (Signed)
NAME:  Judy Ramirez, Judy Ramirez                        ACCOUNT NO.:  [id number]   MEDICAL RECORD NO.:  [id number]                   PATIENT TYPE:  INP   LOCATION:  0451                                 FACILITY:  [facility]   PHYSICIAN:  Ollen Gross, M.D.                 DATE OF BIRTH:  December 09, 1931   DATE OF ADMISSION:  04/17/2004  DATE OF DISCHARGE:  04/30/2004                                 DISCHARGE SUMMARY   ADMISSION DIAGNOSES:  1.  Infection of left total hip arthroplasty.  2.  Status post left total hip arthroplasty, June 2005.  3.  History of bronchitis.  4.  History of pneumonia in September 2004.  5.  Hypertension.  6.  Hypothyroidism.  7.  Surgical postmenopausal.  8.  Obesity.  9.  Polymyalgia rheumatica with history of chronic steroid use.  10. History of methicillin-sensitive Staphylococcus aureus infection in      September 2004 after previous lumbar surgery.   DISCHARGE DIAGNOSES:  1.  Infection of left total hip arthroplasty, status post irrigation and      debridement of left hip, and revision arthroplasty with exchange of      polyethylene liner, femoral stem, and femoral head.  2.  Status post repeat irrigation and debridement of infected total hip.  3.  Status post PICC line placement on April 19, 2004.  4.  Postoperative blood loss anemia.  5.  Status post transfusion times two.  6.  Group B strep agalactiae infection of left hip.  7.  Status post left total hip arthroplasty, June 2005.  8.  History of bronchitis.  9.  History of pneumonia in September 2004.  10. Hypertension.  11. Hypothyroidism.  12. Surgical postmenopausal.  13. Obesity.  14. Polymyalgia rheumatica with history of chronic steroid use.  15. History of methicillin-sensitive Staphylococcus aureus infection in      September 2004 after previous lumbar surgery.   PROCEDURES:  1.  Date of surgery, April 17, 2004, irrigation and debridement of skin,      subcutaneous tissue, muscle, fat, and bone;  revision arthroplasty with      exchange of polyethylene liner, femoral stem, and femoral head;      irrigation of hip with pulse lavage hip system; obtaining of cultures.      Surgeon, Madlyn Frankel. Charlann Boxer, M.D.; assistant, Alexzandrew Julien Girt, PA-C;      anesthesia, general; blood loss, 400 cc. Drains: There was one      superficial drain and also one deep tissue drain.  2.  The patient was taken back to the OR on April 25, 2004, and underwent a      repeat irrigation and debridement of left hip arthroplasty. Surgeon,      Ollen Gross, M.D.; assistant, Alexzandrew L. Perkins, PA-C;      anesthesia, general; minimal blood loss; Blake drain times one sewn into      place.  CONSULTATIONS:  1.  Rehab services, Ellwood Dense, M.D.  2.  [facility], Rosanne Sack, M.D.  3.  Infectious disease, Lacretia Leigh. Ninetta Lights, M.D.   BRIEF HISTORY:  The patient is a 77 year old female who was seen in the  emergency room with less than 24 hour progression of worsening symptoms. She  is status post left total hip about six weeks ago. She was in normal health,  but decompensated over the past 24 hours when she started to have  presentation of fever, became sick and nauseated. She was noted to have  drainage and erythema in the hip area. She was brought to the emergency  department at [facility] and admitted for evaluation.   LABORATORY DATA:  CBC on admission revealed a hemoglobin of 11.8, hematocrit  35.5, white blood cell count 19.1 elevated, red cell count of 4.25.  Differential showed elevated neutrophils of 86, decreased lymphs of 6, monos  7, eosinophils 1, basophil 0. Serial CBCs were followed. The white count did  improve throughout the hospital course; it came down to 16.5 and was last  noted at 15.3. The differential shift which showed an elevated neutrophil of  86 initially came down and was back to normal of 69.  Lymphs came up from a  low level of 6, back  up to a normal level of 19.  PT and PTT on admission  were 13.7 and 30 respectively with an INR of 1.1. Serial pro times followed  per Coumadin protocol and may be found in laboratory section of  this chart.  Last noted PT-INR prior to discharge was 20.1 and 2.1 respectively. Chem  panel on admission revealed an elevated of glucose of 159, decreased albumin  of 3.1. The remaining chem panel all within normal limits. Serial BMETs were  followed. Electrolytes remained all within normal limits with the exception  of CO2 which went up as high as 34 and back down to normal. Glucose went up  from 159 to 189, back down to 128 and back up to 190.  Calcium dropped from  9.0 to 7.9 and back up to 8.3. Albumin dropped from 3.1 to 2.0, back up to  2.5. Total protein dropped from 6.6. to 4.5, back up to 5.3. Hemoglobin A1C  taken on April 22, 2004, showed a normal level of 5.8%. TSH level taken on  April 22, 2004, showed a normal level of 1.343. Urinalysis taken on April 17, 2004, showed small leukocyte esterase, may epithelial cells, 7-10 white  cells, and few bacteria. Urine culture showed no growth. Blood culture times  three taken on April 17, 2004, revealed no growth. Wound culture taken on  April 17, 2004, smear showed rare wbc's, a few gram-positive cocci in pairs  and in chains, rare gram-positive rods. Culture did prove positive for a few  group strep agalactiae; a few diphtheroids also noted.  Abscess culture on  April 17, 2004, again showed few wbc's, few gram-positive cocci in pairs and  unchanged, proved positive for moderate group B strep agalactiae. Tissue  culture taken on April 17, 2004, had no organism seen, but did prove positive  for a few group B strep agalactiae. Wound culture from the second surgery  taken on April 25, 2004, revealed moderate wbc's, rare squamous epithelial cells. Wound culture showed no organisms and no growth. Anaerobic culture  times two taken on April 17, 2004, showed  few wbc's, one showed no organisms,  the other showed a few  gram-positive cocci in pairs and chains, both showed  no anaerobes isolated.  Anaerobic culture taken on April 25, 2004, showed  moderate wbc's, no organisms seen, no anaerobes isolated.   Transthoracic echocardiogram taken on April 18, 2004, showed overall left  ventricular systolic function normal, left ventricular ejection fraction  estimated to range between 55% to 65%. Technically a very difficult study.  Probably no significant MR, but interpretation limited.   EKG dated April 17, 2004, revealed sinus tachycardia, otherwise normal since  last tracing. Rate is faster confirmed by Dr. Donia Guiles. Left hip films  taken on April 17, 2004, showed left total hip replacement and lower lumbar  fusion noted. No acute abnormalities. Two-view chest taken on April 17, 2004,  showed no evidence of focal lung consolidation. Pelvis views taken on April 17, 2004, revealed stable appearance of total hip arthroplasty following  placement of surgical drains. There is no evidence of bone destruction or  fracture.  Ultrasound done on May 20, 2004, projecting a successful left  arm PICC placement.   HOSPITAL COURSE:  The patient admitted to [facility] via the  emergency room for the above stated problems. Seen in consultation by Dr.  Charlann Boxer who is covering for Dr. Lequita Halt who is out of town. The patient was  seen and felt to have an infected left total hip. He was taken to the  operating room later that day and underwent the above-stated procedure  without complications. The patient tolerated the procedure well, later  transferred to the recovery room, and then to the orthopedic floor for  continued postoperative care. [facility] hospitalist, Dr. Lars Mage-  [facility], was consulted to assist with medical management of the  patient. Consultation was also called to infectious disease to Dr. Johny Sax for assistance  with IV antibiotics and treatment of the infected  prosthesis. Cultures were taken in the operating room and also blood  cultures were drawn.  Vancomycin was started in the operating room and  continued. Also ceftriaxone was also continued until the cultures were taken  in the OR which proved what type of infection. UA did initially show some  leukocyte esterase and bacteria. Urine culture was sent. Urine culture did  prove to be negative. She had drains placed at the time of surgery to help  aid in decompression of the infected hip. A rehab consultation was called  postoperatively. The patient had been in rehab from previous surgery. He was  seen in consultation by Dr. Thomasena Edis. He stated they would follow along to  see if there was any kind of need for inpatient care following recovery from  surgery. The patient had a drop in her hemoglobin postoperatively. She was given blood two units. On April 18, 2004, she tolerated blood quite well. She  continued to receive antibiotics until cultures proved to be positive for  group B strep. The vancomycin was discontinued. She was continued on the  ceftriaxone. It was concern that the patient developed a new murmur.  Therefore, an echocardiogram was ordered; echocardiogram as above.  She also  had a PICC line placed on April 19, 2004, per radiology for continued IV  antibiotics. The Foley was discontinued on day two. The incision was  checked, the dressing was changed. The initial surrounding erythema which  was noted at the time of admission had already improved after 48 hours of  treatment and surgical decompression. Sutures were intact. Still have some  serous drainage. Wound was  continued to be followed very closely. Continued  to improve. Due to hypothyroidism, thyroid was checked as noted labs above.  Continued to receive antibiotics via PICC line. She continued to receive  antibiotics that first week while in the hospital. It is recommended by   infectious disease that she would need at least four weeks minimum the  ceftriaxone and then probably switch her over to oral fluoroquinolone. She  was given Diflucan for vaginitis one dose on April 23, 2004.  Dr. Lequita Halt  returned during the middle of her hospital stay and assumed her care. At  that point she was still having some serous drainage in and around the  incision requiring dressing change minimum b.i.d. to t.i.d. Although the  surrounding tissues have improved she still had drainage. It was felt that  she probably had a seroma from the previous surgery. Since it did improve  repeat surgery was discussed with the patient. She was preop and she was  noted to have a lower hemoglobin. Therefore, she was given two more units of  blood. She was tolerating the transfusion well. Hemoglobin came back up.  She was preoperative for the following surgery and then taken back to the  operating room on April 25, 2004, for repeat I&D of the left hip and  decompression of the soft tissue seroma.  She had a Blake drain sewn in  during the second procedure. She tolerated the procedure well. She was  continued on her IV antibiotics via the PICC line. Once she had improved  from a medical standpoint she was able to start work a little bit better  with therapy. Even prior to the second procedure she had already started to  get up with therapy on April 20, 2004, and walked 150 feet and then greater  than 200 feet on July 30th and July 31st. She had a repeat surgery on August  3rd and continued to do well. Hemoglobin remained high following the second  transfusion.  Dressing change after the second surgery and only had a very  scant amount of serous drainage.  At this point, most all the erythema of  surrounding tissue has resolved. The incision looked well. She continued to  be followed closely for another couple of days for medical services and also for infectious disease. Her hypertension was followed  very closely by  [facility] hospitalist, Dr. Jamie Brookes. Her blood pressure was  medication did have to be adjusted. The Cardura was stopped during the  hospital course and she was started on a new prescription of clonidine. With  adjustments in her medication her hemoglobin actually was under much better  control. By April 30, 2004, her blood pressure was under better control. Her  wound had greatly improved following the initial two surgeries.  She was  receiving IV ceftriaxone/Rocephin through a PICC line and continued to  improve. By April 30, 2004, she was seen on rounds by Dr. Lequita Halt who felt  she was stable enough to go home. She had done so well with physical therapy  that she did not need any type of inpatient rehab.   DISCHARGE PLAN:  1.  Patient discharged home on April 30, 2004.  2.  For discharge diagnoses, please see above.  3.  Discharge medications--stop the medication Cardura at home; she is to      start back on clonidine; she is given two prescriptions for clonidine,      one for one month supply to obtain and  she is also given a three-month      mail-in prescription. Rocephin prescription provided for outpatient IV      antibiotic treatment, Percocet for pain, and also Coumadin.  4.  Diet--resume her previous home diet.  5.  Activity--allow the patient to be weightbearing as tolerated; resume her      total hip protocol and hip precautions; she may start showering; may      start driving if not using narcotic pain medications.  6.  Followup--she is to follow up with Dr. Lequita Halt on Thursday following      discharge; to call the office for an appointment. She has a Blake drain      which is sewn in. The drawn will go home with her. Recommend that she      empty and recharge  her drain three times a day and record her output.   DISPOSITION:  Home.   CONDITION ON DISCHARGE:  Improved.     Alexzandrew L. Julien Girt, P.A.              Ollen Gross, M.D.     ALP/MEDQ  D:  05/23/2004  T:  05/23/2004  Job:  [id number]   cc:   Rosanne Sack, M.D.  [address]  [place], [place] [address]  Fax: [phone]   Lacretia Leigh. Ninetta Lights, M.D.  [address]  [place]  [place] [address]  Fax: [phone]   Ellwood Dense, M.D.  [address] [place]  [place] [address]  Fax: [phone]   Erle Crocker, MD  [facility]

## 2011-02-08 NOTE — H&P (Signed)
NAME:  DREAMER, CARILLO                        ACCOUNT NO.:  [id number]   MEDICAL RECORD NO.:  [id number]                   PATIENT TYPE:  INP   LOCATION:  1831                                 FACILITY:  [facility]   PHYSICIAN:  Evelena Peat, M.D.               DATE OF BIRTH:  [birth year]   DATE OF ADMISSION:  06/19/2003  DATE OF DISCHARGE:                                HISTORY & PHYSICAL   PRIMARY CARE PHYSICIAN:  Production assistant, radio.   CHIEF COMPLAINT:  Short of breath.   HISTORY OF PRESENT ILLNESS:  This is a 77 year old white female, who lives  in [place], [place], who presents to [facility]  Emergency Department with progressive dyspnea since Wednesday, four days  ago.  She had recent lumbosacral back surgery April 27, 2003 and  postoperative complication of Staphylococcus wound infection and was getting  vancomycin through a PICC line at [facility] when she first  noticed the dyspnea.  Since that time, she has had more or less continuous  dyspnea, worse when supine and with activity.  She has not had any chest  pain or any chest tightness.  No prior history of congestive heart failure  or any pulmonary problems.  She has had occasional cough but no fever.  She  was seen in office on Thursday by Dr. Marjory Lies, who increased her  prednisone from 5 mg to 10 mg.  She is on chronic prednisone for polymyalgia  rheumatica.  She denies any recent leg pains or edema.  She has had no prior  history of DVT.   Her evaluation here included normal EKG, negative cardiac enzymes, slightly  elevated white blood count, and technically difficult CT for ruling out PE.  She is admitted now for further evaluation.   PAST MEDICAL HISTORY:  1. Hypertension.  2. Hypothyroidism.  3. Polymyalgia rheumatica.  4. Osteoarthritis involving multiple joints.   MEDICATIONS:  1. Lasix 40 mg q.d.  2. L-thyroxine 125 mcg q.d.  3. Rifampin 300 mg q.d.  (for staphylococcus infection).  4. Accupril 40 mg q.d.  5. Cardura 8 mg q.d.  6. Diltiazem XR 300 mg q.d.  7. Prednisone 10 mg p.o. q.d.  8. Vancomycin via her PICC line day 18 of 42.  9. Diclofenac ER 100 mg q.d.   ALLERGIES:  None.   PAST SURGICAL HISTORY:  1. Partial hysterectomy secondary to cervix cancer several years ago.  2. L3/5 laminectomy with L4/5 fusion, April 27, 2003 with the following     complications.     a. Nosocomial pneumonia treated with Biaxin.     b. Wound infection with Staphylococcus aureus, readmitted September 7        through June 06, 2003 for debridement.   SOCIAL HISTORY:  She lives in [place], [place].  She has never  smoked.  No alcohol use.  She has three children, five grandchildren,  and  four great-grandchildren.  She is retired from [employer].   FAMILY HISTORY:  Mother is alive, 51 of Alzheimer's disease.  Father died at  age 43 of lung cancer.  One brother alive and well.  No coronary artery  disease history.  One aunt with diabetes.   REVIEW OF SYMPTOMS:  Denies any recent fevers, chills, headache, sore  throat, pruritic pain, chest pain, abdominal pain, dysuria, or stool  changes.   PHYSICAL EXAMINATION:  VITAL SIGNS:  Temperature 98.0, blood pressure  171/77, pulse 86, respirations 22.  O2 saturation 89% room air and 96% two  liters nasal cannula.  GENERAL:  In general, she is an alert, pleasant obese 77 year old white  female sitting up in no apparent distress.  HEENT:  Pupils are equal, round, and reactive to light.  TMs are normal.  Oropharynx is moist and clear.  NECK:  No JVD.  Supple without masses.  CHEST:  Decreased breath sounds right base with faint rales.  No wheezes or  rubs.  HEART:  Regular rhythm and rate with no murmur or rub.  ABDOMEN:  Soft and nontender without mass.  Normal bowel sounds.  EXTREMITIES:  No edema.  Calves are nontender.  Dorsalis pedis pulses are  palpable bilaterally with good capillary  refill bilaterally.  NEUROLOGICAL:  Strength is 5+/5+ throughout.   LABORATORY DATA:  White count 13.2 thousand with 69% neutrophils.  Hemoglobin 10.1.  Sodium 142, potassium 3.2, glucose 153, CK 31, troponin  0.01.  INR 1.1, BNP is 81.   CT of the chest shows no central emboli.  Technically, this is a difficult  study to rule out peripheral PE with poor contrast penetration.  Questionable patchy infiltrate right lower lobe and right middle lobe.  Ventilation perfusion scan shows low probability.   IMPRESSION:  1. Dyspnea or relatively acute onset four days ago in a patient who has a     history of obesity and recent low back surgery.  Our initial clinical     suspicion was for pulmonary embolus but VQ scan and CT of the chest do     not show any evidence for this.  Also, she has not had any recent lower     extremity findings.  She may have an infiltrate in the right lower lobes     by films but no fever or cough to strongly suggest pneumonia.  There is     no evidence to suggest ischemia.  Early congestive heart failure is a     possibility but her B-type natriuretic peptide is relatively low.  2. Mild anemia.  3. Hypokalemia.  4. Elevated glucose on initial lab with no history of diabetes.  5. Leukocytosis probably secondary to prednisone treatment versus infection.  6. Staphylococcus aureus from recent infected wound. The patient is on a 42-     day treatment of vancomycin.  7. Hypertension poorly controlled on admission.  8. Polymyalgia rheumatic on chronic low dose prednisone.   PLAN:  Admit.  Continue O2 and follow O2 saturations closely.  Check 2-D  echocardiogram.  Add Rocephin for possible right middle lobe and right lower  lobe infiltrate, although her x-ray findings may be indicative of scar or  atelectasis.  Follow blood pressures closely and may need change in her  blood pressure regimen.  If etiology for dyspnea raises doubt, consider possible further pulmonary  workup.  She is certainly at risk for obesity  hypoventilation syndrome but does not have any symptoms to  suggest likely  obstructive sleep apnea.                                                Evelena Peat, M.D.    BB/MEDQ  D:  06/19/2003  T:  06/20/2003  Job:  [id number]

## 2011-02-08 NOTE — Op Note (Signed)
NAME:  LATIMA, HAMZA                        ACCOUNT NO.:  [id number]   MEDICAL RECORD NO.:  [id number]                   PATIENT TYPE:  INP   LOCATION:  3005                                 FACILITY:  [facility]   PHYSICIAN:  Cristi Loron, M.D.            DATE OF BIRTH:  17-Jul-1932   DATE OF PROCEDURE:  04/27/2003  DATE OF DISCHARGE:                                 OPERATIVE REPORT   BRIEF HISTORY:  This patient is a 77 year old white female who suffered from  intractable back and bilateral leg pain consistent with neurogenic  claudication.  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  She had  spondylolisthesis at L4-5 as well.  I discussed the various treatment  options with her including surgery.  The patient weighed the risks,  benefits, and alternatives of surgery and decided to proceed with a lumbar  decompression and fusion.   PREOPERATIVE DIAGNOSES:  L1-2, 2-3, 3-4, 4-5, 5-1 spinal stenosis,  degenerative disease, L4-5 spondylolisthesis, lumbar radiculopathy, lumbago.   POSTOPERATIVE DIAGNOSES:  L1-2, 2-3, 3-4, 4-5, 5-1 spinal stenosis,  degenerative disease, L4-5 spondylolisthesis, lumbar radiculopathy, lumbago.   PROCEDURE:  L4 Gill procedure; L4-5 posterior lumbar interbody fusion;  insertion of bilateral L4-5 interbody prosthesis (tangent 10 x 26 mm bone  prosthesis); posterior nonsegmental instrumentation L4-5 with M10 titanium  pedicle screws and rods; posterior lateral arthrodesis L4-5 with local  morcellized autograft bone and __________  bone scaffolding; bilateral  extensive laminectomy foraminotomy at L2, 3 and 5 to treat the patient's  spinal stenosis at those levels.   SURGEON:  Cristi Loron, M.D.   ASSISTANT:  Coletta Memos, M.D.   ANESTHESIA:  General endotracheal.   ESTIMATED BLOOD LOSS:  500 cc.   SPECIMENS:  None.   DRAINS:  None.   COMPLICATIONS:  None.   DESCRIPTION OF PROCEDURE:  The patient was brought to the operating room by  the anesthesia team.  General endotracheal anesthesia was induced.  The  patient was then turned to the prone position on the Wilson frame.  The  lumbosacral region was then prepared with Betadine Scrub and Betadine  Solution.  Sterile drapes were applied, and then injected the area to be  incised with Marcaine with epinephrine solution.  Used a scalpel to make a  linear incision from approximately L1 to S1.  Used electrocautery to dissect  down to the thoracolumbar fascia and divided the fascia bilaterally,  performing a bilateral subperiosteal dissection, stripping the paraspinous  musculature from the spinous process and lamina of L1, 2, 3, 4, 5, and the  upper sacrum.  We inserted the Versatrac retractor for exposure and then  obtained an intraoperative radiograph to confirm our location.  We had a lot  of difficulty getting adequate x-rays because of  the patient's large back  body habitus when I was able to see adequately with maximal technique x-rays  and was able to count up from the sacrum to confirm the appearance on the x-  ray.   We then used the scalpel to incise the interspinous ligament at L1-2, 2-3, 3-  4, 4-5, and 5-1.  We then used Leksell rongeurs to remove the spinous  process and lamina of L4 and part of the lamina at L4 and then we completed  the Gill procedure at L4 by removing the medial aspect of the abnormal pars  region at the spondylolisthesis, decompressing the thecal sac to perform  foraminotomies at bilateral L4 and L5 nerve roots completing the Gill  procedure.   We then freed up the thecal sac and the L5 nerve root and carefully  retracted it medially with the Deaver retractor and then incised the L4-5  intervertebral disk and performed aggressive diskectomy using pituitary  forceps and Epstein and Scoville  curettes.  We did this bilaterally.   We then prepared the vertebral interspace at L4-5 with the rotating cutter  and the chisel and then inserted a 10 x 26 mm tangent bone prosthesis  bilaterally at L4-5, of course after retracting the neural structures out of  harm's way.  We packed in between the tangent bone prosthesis with a  combination of local morcellized allograft bone and bead calcified bone  scaffolding to complete the posterior arthrodesis.   We now turned our attention to the decompressive laminectomy.  We used the  Leksell rongeur to remove the spinal __________  L2, 3, and the upper aspect  of the L5 spinous process.  We then used a high speed drill to perform  bilateral L2 and L3 foraminotomies and completed the laminectomy at L2 and  L3 with the Kerrison punch, decompressing the thecal sac and performing  foraminotomies about the bilateral L3 and L4 nerve roots, removing the  excess ligamentum flavum from the lateral recesses.  We then used a high  speed drill to cut the cephalad edge of the L5 lamina and then used a  Kerrison punch to remove the cephalad aspect of the L5 lamina, further  decompressing the thecal sac.   We now turned our attention to the posterior __________  most adjacent to L4-  L5.  We used electrocautery to expose the bilateral transverse process at L4  and L5.  We decorticated posterior to the pedicles bilaterally at L4 and L5.  We then cannulated the pedicles with the bone probe, tapped the pedicles,  and had felt inside the tapped pedicles for cortical breaches and thein  inserted 6.5 x 45 mm pedicle screws bilaterally at L4 and L5.  We palpated  along the medial aspect of bilateral L4 and L5 pedicle and noted there were  no cortical breaches and the L4 and L5 nerve roots were not harmed in any  way (we were unable to use fluoroscopy during the placement of the pedicle screws because the patient's body habitus was quite large and would not  allow  [place] to get any useful x-rays via the fluoroscope).  We then connected  the unilateral L4 and L5 pedicle screws with a 40 mm lordotic rod and  secured it into place with the locking caps and torqued them appropriately.  I should mention that before tightening the caps, we did compress the  construct somewhat.   We now turned our attention to the posterolateral arthrodesis at L4-5.  We  used a high speed drill to decorticate the remaining L4-5 facet and L4 pars  region as well as the transverse process bilaterally at L4 and L5.  We then  laid a combination of local morcellized and autograft bone and __________  bone scaffolding over the decorticated __________  structures, completing  the posterolateral arthrodesis.   We then inspected the thecal sac and noted that the bilateral L3, 4, 5 and  S1 nerve roots had been well decompressed.  I considered doing L1  laminotomies, but it did not appear to be necessary in vivo.   We then obtained strenuous hemostasis using bipolar electrocautery and  Gelfoam.  We copiously irrigated the wound out with bacitracin solution and  removed the solution and then removed the Versatrac retractor.  We then  reapproximated the patient's thoracolumbar fascia with interrupted #1 Vicryl  suture, the subcutaneous tissue with interrupted 2-0 Vicryl suture, and the  skin with Steri-Strips and Benzoin.  The wound was then coated with  bacitracin ointment, sterile dressing was applied, and drapes were removed.  The patient was subsequently returned to the supine position where she was  extubated by the anesthesia team and transported to the post-anesthesia care  unit in stable condition.  All sponge, instrument, and needle counts were  correct at the end of this case.                                               Cristi Loron, M.D.    JDJ/MEDQ  D:  04/27/2003  T:  04/28/2003  Job:  [id number]

## 2011-02-08 NOTE — Discharge Summary (Signed)
NAME:  Judy, Ramirez                        ACCOUNT NO.:  [id number]   MEDICAL RECORD NO.:  [id number]                   PATIENT TYPE:  INP   LOCATION:  0468                                 [facility]AC[facility]   PHYSICIAN:  Ollen Gross, M.D.                 DATE OF BIRTH:  [birth year]   DATE OF ADMISSION:  03/05/2004  DATE OF DISCHARGE:  03/12/2004                                 DISCHARGE SUMMARY   ADMISSION DIAGNOSES:  1. Osteoarthritis, left hip.  2. History of bronchitis.  3. History of pneumonia.  4. Hypertension.  5. Hypothyroidism.  6. Surgical menopause.  7. Obesity.  8. Polymyalgia rheumatica.  9. History of staphylococcal infection with previous lumbar surgery.   DISCHARGE DIAGNOSES:  1. Osteoarthritis, left hip, status post left total hip arthroplasty.  2. Postoperative blood loss anemia, status post transfusion without     sequelae.  3. Postoperative hypotension, improved.  4. Postoperative acute renal failure, improved.  5. Addisonian crisis postoperatively, resolved.   PROCEDURE:  The patient was taken to the OR on March 05, 2004, underwent a  left total hip arthroplasty.  Surgeon, Ollen Gross, M.D., assistant  Alexzandrew L. Perkins, P.A.-C.  Anesthesia:  General.  Estimated blood loss  500 mL.  Hemovac drain x1.   CONSULTATIONS:  Medical, Rosanne Sack, M.D.   BRIEF HISTORY:  [name] is a 77 year old female with end-stage  osteoarthritis of the left hip.  Pain has been refractory to nonoperative  management.  Now presents for a total hip arthroplasty.   LABORATORY DATA:  CBC on admission:  Hemoglobin 12.6, hematocrit of 37.9,  white cell count 13.3, red cell count 4.63.  Postop H&H 10.0 and 30.8, last  known H&H 9.2 and 27.4.  Differential on the admission CBC showed  neutrophils of 78, lymphs 13, monos 7, eos 2, baso 0.  PT/PTT on admission  13.3 and 28, respectively, INR of 1.0.  Serial protimes followed, last noted  PT/INR 22.1 and 2.6.   Chemistry panel on admission all within normal limits.  Serial BMETs were followed.  Creatinine went from a normal level of 1.1 up  to 2.4.  Creatinine was back down to 1.1, BUN from 18-24, stayed at last  noted at 25.  Urinalysis on admission was cloudy but otherwise negative.  Blood group type O positive.   X-RAYS:  Preoperative x-rays of the left hip:  Osteoarthritis, left hip.  Postoperative pelvis and hip films show a left total hip arthroplasty.  Preoperative chest x-ray dated June 22, 2003:  Bibasilar atelectasis,  small right pleural effusion.   EKG dated June 19, 2003:  Normal sinus rhythm, rightward axis,  prolonged QT.  Abnormal EKG, confirmed by Armanda Magic, M.D.   HOSPITAL COURSE:  The patient was admitted to [facility] and taken  to the OR and underwent the above-stated procedure without complications.  The patient tolerated the procedure well and  was later transferred to the  recovery room, then to the orthopedic floor to continue postop care.  Vital  signs were followed.  The patient was transferred to the orthopedic floor.  The Hemovac drain placed at time of surgery was pulled on postop day 1.  Postoperatively she was started on PCA for pain control.  She was given 48  hours of postop IV antibiotics, placed to partial weightbearing 50%, total  hip protocol.  PT and OT were consulted to assist with gait training,  ambulation, and ADLs.  It was noted on postop day 2 the patient was a little  bit sedated and also noted to have some hypotension for the past 12 hours.  She was given increased fluids, blood pressure medications were held.  It  was also noted that she had some increased creatinine and going into some  azotemia, possible acute renal failure.  A medical consult was called.  Also, a rehab consult was ordered postoperatively.  The patient was seen by  Ellwood Dense, M.D., who felt that she would need some type of post-  hospital care,  possibly in rehab or [facility] or similar type of facility.  The  patient was seen postoperatively by Dr. Jamie Brookes, felt to have some  hypotension due to acute renal failure and addisonian crisis.  She was noted  to be on chronic prednisone.  She was given steroid boosters.  Blood  pressure medications were held.  Due to the hypotension and the hemoglobin  which was slightly low postoperatively, she was given blood.  The patient  tolerated the blood well.  Once the blood was in, steroids were boosted with  IV hydrocortisone.  It was noted that blood pressure was starting to return.  As blood pressure returned, also her renal function did improve.  Creatinine  came back down.  She had a little bit of mild confusion postoperatively in  the evening on day 1 and morning of day 2 during the initial start of the  addisonian crisis.  Her confusion did resolve.  Blood pressure returned.  The patient was feeling much better after the reinitiation of steroids and  steroid boosting and also the improvement with her hypotension postop.  By  day 3 she was feeling much, much better.  She started to progress well with  physical therapy.  By that time she was up ambulating approximately only  four feet in the morning of day 3 but by day 4, she increased up to 15 feet  and by day 5 20 feet.  From an orthopedic standpoint, her dressing was  changed on postop day 2.  The incision was healing well and only had some  minimal scant drainage.  She had some slight blisters, which were covered  with Tegaderm.  She continued to slowly improve with physical therapy.  It  was felt that she would require some type of post-hospital care.  Foley was  discontinued on day 4, and she was voiding well.  She was placed on Coumadin  postop for DVT prophylaxis.  Several pertinent medical issues during the  hospital course had improved.  She continued to do well and continued to mobilize through the weekend and on Monday, March 12, 2004, it was noted that  her addisonian crisis had resolved, her acute renal failure had resolved,  the blood pressure had stabilized.  She had a little bit of low potassium,  but that was being replenished with potassium supplements.  Her other  medical  issues were stable.  It was noted that there were a couple of bed  offers, a possible transfer to rehab [facility] versus skilled nursing facility.  There were some bed offers possibly Monday at [facility], [facility], and [facility], [facility], also to  include [facility], also [facility] and [address].  The case  manager will be discussing these separate issues with the patient and once  her disposition has been determined, she will likely be transferred over to  the facility of choice on Monday, March 12, 2004.   DISCHARGE PLAN:  The patient will be transferred to the facility of choice  for continued postoperative care on March 12, 2004.   DISCHARGE DIAGNOSES:  Please see above.   DISCHARGE MEDICATIONS:  1. Coumadin for DVT prophylaxis and protocol.  She needs to be on Coumadin     for a total of three weeks, target INR between 2.0 and 3.0.  2. Colace 100 mg p.o. b.i.d.  3. Lasix 40 mg p.o. daily.  4. Cardura 8 mg p.o. q.h.s.  5. Detrol 4 mg p.o. daily.  6. Protonix 40 mg p.o. daily.  7. Levothyroxine 137.5 mcg p.o. daily.  8. Diltiazem 360 mg p.o. daily.  9. Potassium chloride 20 mEq daily.  10.      Prednisone 10 mg daily.  11.      Lisinopril 20 mg daily.  12.      Methocarbamol, Robaxin 500 mg p.o. q.6h. p.r.n. spasm.  13.      Restoril 15-30 mg p.o. q.h.s.  14.      Senokot p.r.n.  15.      Tylenol one or two every four to six hours as needed for mild pain,     temperature, or headache.  16.      Darvocet one or two every four to six hours as needed for pain.  17.      Cepacol lozenges as needed.   DIET:  Low-sodium diet.   ACTIVITY:  Ms. Stucki is a 77 year old  female who has undergone total hip.  She was advanced to weightbearing as tolerated to the left lower extremity.  She will need to continue with physical therapy and occupational therapy for  gait training, ambulation, and ADLs.  She needs to adhere to total hip  precautions for total hip protocol, continue to mobilize for mobility.  She  needs to be up out of bed b.i.d.  Continue with daily dressing changes.  The  patient may start showering.   FOLLOW-UP:  She needs to follow up two weeks from surgery.  Please contact  the office at [phone], to set up an appointment time and transfer, set up  an appointment for likely next week, the week of March 19, 2004.  Please call  the office to make arrangements.   DISPOSITION:  To be determined.  The case manager will discuss these issues  with the patient this morning.  At the time of the dictation, will transfer  to the facility of choice.   CONDITION ON DISCHARGE:  Improving.    Alexzandrew L. Julien Girt, P.A.              Ollen Gross, M.D.    ALP/MEDQ  D:  03/12/2004  T:  03/12/2004  Job:  [id number]   cc:   Rosanne Sack, M.D.  [address]  [place], [place] [address]  Fax: [phone]   Rema Fendt, M.D.  [facility]

## 2011-05-07 ENCOUNTER — Ambulatory Visit: Payer: Medicare Other | Admitting: Pulmonary Disease

## 2011-05-18 ENCOUNTER — Encounter: Payer: Self-pay | Admitting: *Deleted

## 2011-05-18 ENCOUNTER — Emergency Department (HOSPITAL_BASED_OUTPATIENT_CLINIC_OR_DEPARTMENT_OTHER)
Admission: EM | Admit: 2011-05-18 | Discharge: 2011-05-19 | Disposition: A | Discharge status: Other Acute Inpatient Hospital | Payer: Medicare Other | Source: Home / Self Care | Attending: Emergency Medicine | Admitting: Emergency Medicine

## 2011-05-18 ENCOUNTER — Emergency Department (INDEPENDENT_AMBULATORY_CARE_PROVIDER_SITE_OTHER): Payer: Medicare Other

## 2011-05-18 DIAGNOSIS — N390 Urinary tract infection, site not specified: Secondary | ICD-10-CM

## 2011-05-18 DIAGNOSIS — A419 Sepsis, unspecified organism: Secondary | ICD-10-CM | POA: Insufficient documentation

## 2011-05-18 DIAGNOSIS — E079 Disorder of thyroid, unspecified: Secondary | ICD-10-CM | POA: Insufficient documentation

## 2011-05-18 DIAGNOSIS — I10 Essential (primary) hypertension: Secondary | ICD-10-CM | POA: Insufficient documentation

## 2011-05-18 DIAGNOSIS — G4730 Sleep apnea, unspecified: Secondary | ICD-10-CM | POA: Insufficient documentation

## 2011-05-18 DIAGNOSIS — Z79899 Other long term (current) drug therapy: Secondary | ICD-10-CM | POA: Insufficient documentation

## 2011-05-18 DIAGNOSIS — R509 Fever, unspecified: Secondary | ICD-10-CM | POA: Insufficient documentation

## 2011-05-18 DIAGNOSIS — I517 Cardiomegaly: Secondary | ICD-10-CM

## 2011-05-18 HISTORY — DX: Disorder of thyroid, unspecified: E07.9

## 2011-05-18 HISTORY — DX: Essential (primary) hypertension: I10

## 2011-05-18 HISTORY — DX: Sleep apnea, unspecified: G47.30

## 2011-05-18 LAB — COMPREHENSIVE METABOLIC PANEL
AST: 18 U/L (ref 0–37)
CO2: 25 mEq/L (ref 19–32)
Calcium: 9.3 mg/dL (ref 8.4–10.5)
Creatinine, Ser: 1 mg/dL (ref 0.50–1.10)
GFR calc Af Amer: >60 mL/min (ref >60)
GFR calc non Af Amer: 54 mL/min — ABNORMAL LOW (ref >60)
Glucose, Bld: 150 mg/dL — ABNORMAL HIGH (ref 70–99)

## 2011-05-18 LAB — DIFFERENTIAL
Basophils Absolute: 0 10*3/uL (ref 0.0–0.1)
Eosinophils Relative: 0 % (ref 0–5)
Lymphocytes Relative: 2 % — ABNORMAL LOW (ref 12–46)
Monocytes Absolute: 2 10*3/uL — ABNORMAL HIGH (ref 0.1–1.0)

## 2011-05-18 LAB — URINE MICROSCOPIC-ADD ON

## 2011-05-18 LAB — URINALYSIS, ROUTINE W REFLEX MICROSCOPIC
Nitrite: NEGATIVE
Specific Gravity, Urine: 1.021 (ref 1.005–1.030)
pH: 5.5 (ref 5.0–8.0)

## 2011-05-18 LAB — CBC
HCT: 40.1 % (ref 36.0–46.0)
MCV: 86.1 fL (ref 78.0–100.0)
RDW: 14.9 % (ref 11.5–15.5)
WBC: 22.7 10*3/uL — ABNORMAL HIGH (ref 4.0–10.5)

## 2011-05-18 LAB — LACTIC ACID, PLASMA: Lactic Acid, Venous: 3.5 mmol/L — ABNORMAL HIGH (ref 0.5–2.2)

## 2011-05-18 MED ORDER — PIPERACILLIN-TAZOBACTAM 3.375 G IVPB
3.3750 g | Freq: Once | INTRAVENOUS | Status: DC
  Administered 2011-05-18: 3.375 g via INTRAVENOUS

## 2011-05-18 MED ORDER — SODIUM CHLORIDE 0.9 % IV BOLUS (SEPSIS)
1000.0000 mL | Freq: Once | INTRAVENOUS | Status: AC
  Administered 2011-05-18: 1000 mL via INTRAVENOUS

## 2011-05-18 MED ORDER — KETOROLAC TROMETHAMINE 30 MG/ML IJ SOLN
30.0000 mg | Freq: Once | INTRAMUSCULAR | Status: AC
  Administered 2011-05-18: 30 mg via INTRAVENOUS
  Filled 2011-05-18: qty 1

## 2011-05-18 MED ORDER — PIPERACILLIN-TAZOBACTAM 4.5 G IVPB
4.5000 g | Freq: Once | INTRAVENOUS | Status: DC
  Filled 2011-05-18: qty 100

## 2011-05-18 MED ORDER — PIPERACILLIN-TAZOBACTAM 3.375 G IVPB
INTRAVENOUS | Status: AC
  Administered 2011-05-18: 3.375 g via INTRAVENOUS
  Filled 2011-05-18: qty 50

## 2011-05-18 MED ORDER — SODIUM CHLORIDE 0.9 % IV SOLN
Freq: Once | INTRAVENOUS | Status: AC
  Administered 2011-05-18: 22:00:00 via INTRAVENOUS

## 2011-05-18 MED ORDER — VANCOMYCIN HCL IN DEXTROSE 1-5 GM/200ML-% IV SOLN
1000.0000 mg | Freq: Once | INTRAVENOUS | Status: AC
  Administered 2011-05-18: 1000 mg via INTRAVENOUS
  Filled 2011-05-18: qty 200

## 2011-05-18 NOTE — ED Notes (Signed)
Pt c/o fever , chills and ear pain onset 1630. Took Tylenol 1 gm at 1615

## 2011-05-18 NOTE — ED Notes (Signed)
Respiratory to arterially stick patient for blood cultures per VO from Dr Brooke Dare.  Unable to obtain blood cultures peripherally after multiple attempts to stick her.

## 2011-05-18 NOTE — ED Provider Notes (Signed)
History   Chart scribed for Dayton Bailiff, MD by Dayton Bailiff; the patient was seen in room MH08/MH08; this patient's care was started at 7:04 PM.    CSN: [id number] Arrival date & time: 05/18/2011  6:58 PM  Chief Complaint  Patient presents with  . Fever   HPI Judy Ramirez is a 77 y.o. female who presents to the Emergency Department complaining of fever. Pt reports sudden onset of chills/shaking approx 2 hours ago with associated mild brief nausea; subjective fever but not recorded at home. Pt took 2 tylenol, c/o her ear's feeling "full" after taking the tylenol, no ear pain. Shaking/chills resolved after tylenol. No sore throat, cough, congestion, ha, vomiting, diarrhea, or dysuria. Pt with DOE that is unchanged from baseline.    PAST MEDICAL HISTORY:  Past Medical History  Diagnosis Date  . Hypertension   . Sleep apnea   . Thyroid disease      PAST SURGICAL HISTORY:  Past Surgical History  Procedure Date  . Abdominal hysterectomy   . Back surgery   . Total hip arthroplasty     MEDICATIONS:  Previous Medications   ACETAMINOPHEN (TYLENOL) 500 MG TABLET    Take 1,000 mg by mouth once as needed. For pain     AMLODIPINE (NORVASC) 5 MG TABLET    Take 5 mg by mouth daily.     DICLOFENAC (VOLTAREN) 75 MG EC TABLET    Take 75 mg by mouth 2 (two) times daily.     FUROSEMIDE (LASIX) 40 MG TABLET    Take 40 mg by mouth daily.     LEVOTHYROXINE (SYNTHROID, LEVOTHROID) 137 MCG TABLET    Take 137 mcg by mouth daily.     MULTIPLE VITAMINS-MINERALS (PRESERVISION AREDS PO)    Take 2 tablets by mouth daily.     OLMESARTAN (BENICAR) 40 MG TABLET    Take 40 mg by mouth daily.     OMEPRAZOLE (PRILOSEC) 20 MG CAPSULE    Take 20 mg by mouth daily.     POTASSIUM CHLORIDE SA (K-DUR,KLOR-CON) 20 MEQ TABLET    Take 80 mEq by mouth daily.     VITAMIN E (VITAMIN E) 1000 UNIT CAPSULE    Take 2,000 Units by mouth daily.       ALLERGIES:  Allergies as of 05/18/2011  . (No Known Allergies)      FAMILY HISTORY:  History reviewed. No pertinent family history.   SOCIAL HISTORY: History   Social History  . Marital Status: Widowed    Spouse Name: N/A    Number of Children: N/A  . Years of Education: N/A   Occupational History  . Not on file.   Social History Main Topics  . Smoking status: Never Smoker   . Smokeless tobacco: Not on file  . Alcohol Use: No  . Drug Use: No  . Sexually Active:    Other Topics Concern  . Not on file   Social History Narrative  . No narrative on file     Review of Systems 10 Systems reviewed and are negative for acute change except as noted in the HPI.  Physical Exam  BP 161/91  Pulse 102  Temp(Src) 99 F (37.2 C) (Oral)  Resp 20  Ht 5\' 8"  (1.727 m)  Wt 365 lb (165.563 kg)  BMI 55.50 kg/m2  SpO2 98%  Physical Exam  Nursing note and vitals reviewed. Constitutional: She is oriented to person, place, and time. She appears well-developed and well-nourished. No distress.  HENT:  Head: Normocephalic.  Mouth/Throat: Oropharynx is clear and moist and mucous membranes are normal.       TMs clear bilaterally  Eyes: Conjunctivae are normal. Pupils are equal, round, and reactive to light.  Neck: Normal range of motion. Neck supple.       No mengismus  Cardiovascular: Regular rhythm and intact distal pulses.  Exam reveals no gallop and no friction rub.   No murmur heard.      tachycardia  Pulmonary/Chest: Effort normal and breath sounds normal. She has no wheezes. She has no rales.  Abdominal: Soft. There is no tenderness.       No cva tenderness  Musculoskeletal: Normal range of motion. She exhibits no edema.  Neurological: She is alert and oriented to person, place, and time.  Skin: Skin is warm and dry. No rash noted.  Psychiatric: She has a normal mood and affect.    ED Course  Procedures  OTHER DATA REVIEWED: Nursing notes and vital signs reviewed. Prior records reviewed.  LABS / RADIOLOGY: Results for orders  placed during the hospital encounter of 05/18/11  CBC      Component Value Range   WBC 22.7 (*) 4.0 - 10.5 (K/uL)   RBC 4.66  3.87 - 5.11 (MIL/uL)   Hemoglobin 13.2  12.0 - 15.0 (g/dL)   HCT 91.4  78.2 - 95.6 (%)   MCV 86.1  78.0 - 100.0 (fL)   MCH 28.3  26.0 - 34.0 (pg)   MCHC 32.9  30.0 - 36.0 (g/dL)   RDW 21.3  08.6 - 57.8 (%)   Platelets 141 (*) 150 - 400 (K/uL)  DIFFERENTIAL      Component Value Range   Neutrophils Relative 89 (*) 43 - 77 (%)   Neutro Abs 20.1 (*) 1.7 - 7.7 (K/uL)   Lymphocytes Relative 2 (*) 12 - 46 (%)   Lymphs Abs 0.5 (*) 0.7 - 4.0 (K/uL)   Monocytes Relative 9  3 - 12 (%)   Monocytes Absolute 2.0 (*) 0.1 - 1.0 (K/uL)   Eosinophils Relative 0  0 - 5 (%)   Eosinophils Absolute 0.1  0.0 - 0.7 (K/uL)   Basophils Relative 0  0 - 1 (%)   Basophils Absolute 0.0  0.0 - 0.1 (K/uL)  COMPREHENSIVE METABOLIC PANEL      Component Value Range   Sodium 139  135 - 145 (mEq/L)   Potassium 3.5  3.5 - 5.1 (mEq/L)   Chloride 101  96 - 112 (mEq/L)   CO2 25  19 - 32 (mEq/L)   Glucose, Bld 150 (*) 70 - 99 (mg/dL)   BUN 20  6 - 23 (mg/dL)   Creatinine, Ser 4.69  0.50 - 1.10 (mg/dL)   Calcium 9.3  8.4 - 62.9 (mg/dL)   Total Protein 7.0  6.0 - 8.3 (g/dL)   Albumin 3.7  3.5 - 5.2 (g/dL)   AST 18  0 - 37 (U/L)   ALT 17  0 - 35 (U/L)   Alkaline Phosphatase 66  39 - 117 (U/L)   Total Bilirubin 0.6  0.3 - 1.2 (mg/dL)   GFR calc non Af Amer 54 (*) >60 (mL/min)   GFR calc Af Amer >60  >60 (mL/min)  URINALYSIS, ROUTINE W REFLEX MICROSCOPIC      Component Value Range   Color, Urine YELLOW  YELLOW    Appearance CLEAR  CLEAR    Specific Gravity, Urine 1.021  1.005 - 1.030    pH 5.5  5.0 - 8.0    Glucose, UA NEGATIVE  NEGATIVE (mg/dL)   Hgb urine dipstick NEGATIVE  NEGATIVE    Bilirubin Urine NEGATIVE  NEGATIVE    Ketones, ur 15 (*) NEGATIVE (mg/dL)   Protein, ur NEGATIVE  NEGATIVE (mg/dL)   Urobilinogen, UA 1.0  0.0 - 1.0 (mg/dL)   Nitrite NEGATIVE  NEGATIVE    Leukocytes,  UA TRACE (*) NEGATIVE   LACTIC ACID, PLASMA      Component Value Range   Lactic Acid, Venous 3.5 (*) 0.5 - 2.2 (mmol/L)  URINE MICROSCOPIC-ADD ON      Component Value Range   Squamous Epithelial / LPF FEW (*) RARE    WBC, UA 3-6  <3 (WBC/hpf)   Bacteria, UA MANY (*) RARE    Casts HYALINE CASTS (*) NEGATIVE    Urine-Other MUCOUS PRESENT     Dg Chest 2 View  05/18/2011  *RADIOLOGY REPORT*  Clinical Data: Fever.  CHEST - 2 VIEW  Comparison: 09/06/2010  Findings: There is mild cardiomegaly.  Vascular congestion with probable interstitial edema.  There is left basilar atelectasis. No effusions or acute bony abnormality.  IMPRESSION: Cardiomegaly.  Suspect mild interstitial edema.  Left base atelectasis.  Original Report Authenticated By: Cyndie Chime, M.D.    ED COURSE: Patient received IV fluid bolus as well as maintenance IV fluids. She was found have an elevated lactate of 3.5 as well as a white count of 22.7. She is a mild urinary tract infection. Patient remained stable one emergency department and without complaints. Antibiotics were started. She was started on broad-spectrum vancomycin and Zosyn.  MDM: Other than urinary tract infection I did not identify any additional source of infection. Given the elevation in lactate and the tachycardia I am concerned about early onset sepsis in this patient. She was hydrated aggressively and remained stable one emergency department. She was started on broad-spectrum antibiotics. She received antipyretics. Patient remained asymptomatic and without complaints. Given the elevated lactate there is evidence of beginnings of endorgan damage. Patient was discussed with the triad hospitalist who accepted the patient for admission to a telemetry bed.  IMPRESSION: 1. Urinary tract infection   2. Sepsis      PLAN: Admit All results reviewed and discussed with pt, questions answered, pt agreeable with plan.   CONDITION ON DISCHARGE: stable   MEDS  GIVEN IN ED:  Medications  amLODipine (NORVASC) 5 MG tablet (not administered)  omeprazole (PRILOSEC) 20 MG capsule (not administered)  diclofenac (VOLTAREN) 75 MG EC tablet (not administered)  potassium chloride SA (K-DUR,KLOR-CON) 20 MEQ tablet (not administered)  olmesartan (BENICAR) 40 MG tablet (not administered)  furosemide (LASIX) 40 MG tablet (not administered)  levothyroxine (SYNTHROID, LEVOTHROID) 137 MCG tablet (not administered)  Multiple Vitamins-Minerals (PRESERVISION AREDS PO) (not administered)  vitamin E (VITAMIN E) 1000 UNIT capsule (not administered)  acetaminophen (TYLENOL) 500 MG tablet (not administered)  vancomycin (VANCOCIN) IVPB 1000 mg/200 mL premix (1000 mg Intravenous Given 05/18/11 2152)  piperacillin-tazobactam (ZOSYN) IVPB 3.375 g (not administered)  ketorolac (TORADOL) injection 30 mg (30 mg Intravenous Given 05/18/11 1949)  sodium chloride 0.9 % bolus 1,000 mL (1000 mL Intravenous Given 05/18/11 1949)  0.9 %  sodium chloride infusion (  Intravenous New Bag 05/18/11 2147)     DISCHARGE MEDICATIONS: New Prescriptions   No medications on file     SCRIBE ATTESTATION: I personally performed the services described in this documentation, which was scribed in my presence. The recorded information has been reviewed and considered.  Dayton Bailiff, MD 05/18/11 2208

## 2011-05-19 ENCOUNTER — Inpatient Hospital Stay (HOSPITAL_COMMUNITY)
Admission: AD | Admit: 2011-05-19 | Discharge: 2011-05-20 | DRG: 872 | Disposition: A | Discharge status: Home / Self Care | Payer: Medicare Other | Source: Other Acute Inpatient Hospital | Attending: Internal Medicine | Admitting: Internal Medicine

## 2011-05-19 ENCOUNTER — Inpatient Hospital Stay (HOSPITAL_COMMUNITY): Payer: Medicare Other

## 2011-05-19 DIAGNOSIS — E872 Acidosis, unspecified: Secondary | ICD-10-CM | POA: Diagnosis present

## 2011-05-19 DIAGNOSIS — Z8711 Personal history of peptic ulcer disease: Secondary | ICD-10-CM

## 2011-05-19 DIAGNOSIS — A419 Sepsis, unspecified organism: Principal | ICD-10-CM | POA: Diagnosis present

## 2011-05-19 DIAGNOSIS — B3731 Acute candidiasis of vulva and vagina: Secondary | ICD-10-CM | POA: Diagnosis present

## 2011-05-19 DIAGNOSIS — Z8614 Personal history of Methicillin resistant Staphylococcus aureus infection: Secondary | ICD-10-CM

## 2011-05-19 DIAGNOSIS — Z8541 Personal history of malignant neoplasm of cervix uteri: Secondary | ICD-10-CM

## 2011-05-19 DIAGNOSIS — Z79899 Other long term (current) drug therapy: Secondary | ICD-10-CM

## 2011-05-19 DIAGNOSIS — Z96649 Presence of unspecified artificial hip joint: Secondary | ICD-10-CM

## 2011-05-19 DIAGNOSIS — K219 Gastro-esophageal reflux disease without esophagitis: Secondary | ICD-10-CM | POA: Diagnosis present

## 2011-05-19 DIAGNOSIS — J449 Chronic obstructive pulmonary disease, unspecified: Secondary | ICD-10-CM | POA: Diagnosis present

## 2011-05-19 DIAGNOSIS — B373 Candidiasis of vulva and vagina: Secondary | ICD-10-CM | POA: Diagnosis present

## 2011-05-19 DIAGNOSIS — N390 Urinary tract infection, site not specified: Secondary | ICD-10-CM | POA: Diagnosis present

## 2011-05-19 DIAGNOSIS — I10 Essential (primary) hypertension: Secondary | ICD-10-CM | POA: Diagnosis present

## 2011-05-19 DIAGNOSIS — E039 Hypothyroidism, unspecified: Secondary | ICD-10-CM | POA: Diagnosis present

## 2011-05-19 DIAGNOSIS — J4489 Other specified chronic obstructive pulmonary disease: Secondary | ICD-10-CM | POA: Diagnosis present

## 2011-05-19 DIAGNOSIS — G4733 Obstructive sleep apnea (adult) (pediatric): Secondary | ICD-10-CM | POA: Diagnosis present

## 2011-05-19 LAB — COMPREHENSIVE METABOLIC PANEL
ALT: 15 U/L (ref 0–35)
AST: 18 U/L (ref 0–37)
Albumin: 2.9 g/dL — ABNORMAL LOW (ref 3.5–5.2)
Alkaline Phosphatase: 56 U/L (ref 39–117)
Calcium: 8.6 mg/dL (ref 8.4–10.5)
GFR calc Af Amer: >60 mL/min (ref >60)
Glucose, Bld: 134 mg/dL — ABNORMAL HIGH (ref 70–99)
Potassium: 3.4 mEq/L — ABNORMAL LOW (ref 3.5–5.1)
Sodium: 139 mEq/L (ref 135–145)
Total Protein: 5.9 g/dL — ABNORMAL LOW (ref 6.0–8.3)

## 2011-05-19 LAB — DIFFERENTIAL
Basophils Absolute: 0 10*3/uL (ref 0.0–0.1)
Basophils Relative: 0 % (ref 0–1)
Eosinophils Absolute: 0 10*3/uL (ref 0.0–0.7)
Monocytes Absolute: 1.3 10*3/uL — ABNORMAL HIGH (ref 0.1–1.0)
Neutro Abs: 18.8 10*3/uL — ABNORMAL HIGH (ref 1.7–7.7)
Neutrophils Relative %: 89 % — ABNORMAL HIGH (ref 43–77)

## 2011-05-19 LAB — CBC
HCT: 35 % — ABNORMAL LOW (ref 36.0–46.0)
Hemoglobin: 11.4 g/dL — ABNORMAL LOW (ref 12.0–15.0)
Hemoglobin: 11.6 g/dL — ABNORMAL LOW (ref 12.0–15.0)
MCH: 28.4 pg (ref 26.0–34.0)
MCHC: 32.7 g/dL (ref 30.0–36.0)
MCHC: 33.1 g/dL (ref 30.0–36.0)
MCV: 85.8 fL (ref 78.0–100.0)
Platelets: 132 10*3/uL — ABNORMAL LOW (ref 150–400)
RBC: 4.08 MIL/uL (ref 3.87–5.11)

## 2011-05-19 NOTE — ED Notes (Signed)
All LDA's (lines, drains, airways, and wounds) were removed from documentation for discharge purposes.  At time of discharge all LDA's remained intact as were previously documented.    

## 2011-05-20 LAB — BASIC METABOLIC PANEL
BUN: 14 mg/dL (ref 6–23)
CO2: 31 mEq/L (ref 19–32)
Calcium: 9 mg/dL (ref 8.4–10.5)
GFR calc non Af Amer: >60 mL/min (ref >60)
Glucose, Bld: 127 mg/dL — ABNORMAL HIGH (ref 70–99)

## 2011-05-20 LAB — URINE CULTURE: Culture  Setup Time: 201208260045

## 2011-05-24 LAB — CULTURE, BLOOD (ROUTINE X 2): Culture  Setup Time: 201208252342

## 2011-05-25 LAB — CULTURE, BLOOD (ROUTINE X 2): Culture: NO GROWTH

## 2011-06-05 NOTE — Discharge Summary (Signed)
Judy Ramirez, Judy Ramirez NO.:  [id number]  MEDICAL RECORD NO.:  [id number]  LOCATION:  5530                         FACILITY:  [facility]  PHYSICIAN:  Peggye Pitt, M.D. DATE OF BIRTH:  Nov 07, 1931  DATE OF ADMISSION:  05/19/2011 DATE OF DISCHARGE:  05/20/2011                              DISCHARGE SUMMARY   PRIMARY CARE PHYSICIAN:  Corrie Mckusick, MD, [facility]  DISCHARGE DIAGNOSES: 1. Early sepsis secondary to presumed urinary tract infection. 2. Urinary tract infection. 3. Vaginal candidiasis. 4. Chronic obstructive pulmonary disease. 5. Hypertension. 6. Morbid obesity. 7. Obstructive sleep apnea. 8. Hypothyroidism. 9. History of peptic ulcer disease.  DISCHARGE MEDICATIONS: 1. Levaquin 500 mg daily for 5 days. 2. Tylenol 500 mg 2 tablets every 6 hours as needed for pain. 3. Amlodipine 5 mg daily. 4. Benicar 40 mg daily. 5. Diclofenac 75 mg twice daily. 6. Lasix 40 mg daily. 7. Synthroid 137 mcg daily. 8. Omeprazole 20 mg daily. 9. KCl 20 mEq 4 tablets daily. 10.PreserVision over-the-counter 2 tablets daily. 11.Vitamin E 1000 units 2 capsules daily.  DISPOSITION AND FOLLOWUP:  [name] will be discharged home today in stable and improved condition.  She need to follow up with her primary care physician within 2 weeks after discharge.  CONSULTATIONS THIS HOSPITALIZATION:  None.  IMAGES AND PROCEDURES: 1. Chest x-ray on May 18, 2011 that showed left base atelectasis     and mild interstitial edema. 2. Acute abdominal series on May 19, 2011 that showed normal bowel     gas pattern with no free air and patchy atelectasis at  the left     lung base.  HISTORY AND PHYSICAL:  For full details, please refer to dictation on May 19, 2011 by Dr. Gerri Lins.  However, in brief, [name] is a 77- year-old obese, very pleasant white woman who came into the hospital complaining of shaking chills and fever.   She states she was her usual self, went to a party on the afternoon of admission, and started to have chills, fever, nausea, and weakness.  Because of this, we were asked to admit her for further evaluation.  HOSPITAL COURSE BY PROBLEM: 1. Early sepsis.  She was found to have an elevated lactic acid.  On     admission, her leukocytosis was 19,000 which was down to 12,000 at     time of discharge.  Her urinalysis was dirty and hence she was     started on Rocephin for treatment of a presumptive UTI.  On day of     discharge, she was afebrile.  Her leukocytosis was resolving.  Her     urine culture has come back negative, however, I wonder if this is     now partially treated.  She will be sent home to complete a total 5-     day course of antibiotics. 2. Vaginal candidiasis.  The patient is complaining today of vaginal     itching.  She states she usually gets vaginal yeast with     antibiotics.  I will give her fluconazole 150 dose x1.  Rest of chronic conditions are stable.  Home medications have not  been altered.  DISCHARGE VITAL SIGNS:  Blood pressure 140/76, heart rate 75, respirations 21, temperature of 97.4, and sats 98 on 2 L.     Peggye Pitt, M.D.     EH/MEDQ  D:  05/20/2011  T:  05/20/2011  Job:  [id number]  cc:   Corrie Mckusick, M.D.  Electronically Signed by Peggye Pitt M.D. on 06/05/2011 02:24:34 PM

## 2011-06-11 NOTE — H&P (Signed)
Judy Ramirez, TYNDALL              ACCOUNT NO.:  [id number]  MEDICAL RECORD NO.:  [id number]  LOCATION:  5530                         FACILITY:  [facility]  PHYSICIAN:  Geniyah Eischeid, DO         DATE OF BIRTH:  [birth year]  DATE OF ADMISSION:  05/19/2011 DATE OF DISCHARGE:                             HISTORY & PHYSICAL   CHIEF COMPLAINT:  Shaking chills, fever.  HISTORY OF PRESENT ILLNESS:  The patient is a 77 year old female who was at a function this afternoon.  She had been feeling like her usual self, but when she got to this party, she started having shaking chills, fever, nausea and generalized weakness.  She was brought to the emergency department.  The patient states she did not vomit, but she did feel nauseous.  PAST MEDICAL HISTORY:  Significant for: 1. Obstructive sleep apnea, which is severe for which she wears CPAP. 2. Question of a possible atrial fibrillation on polysomnography 3     years ago. 3. Morbid obesity. 4. Osteoarthritis. 5. Hypertension. 6. Bronchitis. 7. Hyperlipidemia. 8. Hypothyroidism. 9. History of MRSA. 10.Peptic ulcer disease. 11.Mediastinal mass seen on CT in 2009.  It was recommended that she     get followup CTs, which I do not believe she has done to follow     this and if it grew, then they suggested biopsy. 12.COPD with bronchitis and hypercarbia on ABGs in the past. 13.Cervical cancer. 14.GERD.  PAST SURGICAL HISTORY:  Significant for: 1. Right retinal detachment surgery. 2. Total abdominal hysterectomy. 3. Back surgery L4  and L5 levels. 4. Hip replacement surgery.  MEDICATIONS AT HOME:  Include: 1. Diltiazem CD 300 mg 1 p.o. daily. 2. Docusate sodium 50 mg 1 p.o. daily. 3. Doxazosin 8 mg 1 p.o. daily. 4. Ferrous sulfate 325 one p.o. 3 times daily with food. 5. Fluticasone 50 mcg nasal spray daily 2 sprays daily. 6. Lasix 40 mg 1 p.o. daily. 7. Levothyroxine 137 mcg 1 p.o. daily. 8. Metanx 3 mg/35 mg/2 mg 1 p.o. daily. 9.  Moxifloxacin 400 mg 1 p.o. daily. 10.Olmesartan 20 mg 1 p.o. daily. 11.Omeprazole 20 mg 1 p.o. daily. 12.Oxycodone 5 mg immediate release 1 p.o. q.4 h p.r.n. pain. 13.PreserVision 2 p.o. daily. 14.Ocean nasal spray, 1 spray as needed. 15.Ventolin HFA 2 puffs q.4 h p.r.n. dyspnea. 16.Vigamox 0.5% ophthalmic, left eye 1 drop 4 times daily.  ALLERGIES:  CRESTOR, ZOCOR, and NORVASC.  REVIEW OF SYSTEMS:  CONSTITUTIONAL:  Positive for fever.  Positive for shaking chills today.  Positive for weakness.  Positive for fatigue. CNS:  No headaches.  No seizures.  No limb weakness.  ENT:  No nasal congestion or throat pain.  No coryza.  CARDIOVASCULAR:  No chest pain. No palpitations.  No orthopnea.  Positive for chronic left shoulder and arm pain.  GASTROINTESTINAL:  Positive for nausea.  Negative for vomiting.  Negative for constipation.  Negative for diarrhea. GENITOURINARY:  No dysuria.  No hematuria.  No urinary frequency. RENAL:  No flank pain.  No swelling.  No pruritus.  SKIN:  No rashes. No soreness.  No lesions.  HEMATOLOGIC:  No easy bruising.  No clots. LYMPHS:  No lymphadenopathy.  No painful nodes or specific lymph swelling.  PSYCHIATRIC:  No anxiety.  No depression.  No insomnia.  SOCIAL HISTORY:  No tobacco.  No alcohol.  No recreational drug use.  FAMILY HISTORY:  Mother had dementia.  Father had lung cancer.  Brother with heart disease.  PHYSICAL EXAMINATION:  VITAL SIGNS:  Temperature 98.4, pulse 97, respiratory rate 20, blood pressure 155/81. GENERAL:  The patient is a morbidly obese, chronically ill-appearing female.  She is awake, alert and oriented x3 with her BiPAP on.  It is quite difficult to understand her. EYES:  Pupils are equal, round, and reactive to light and accommodation. External ocular movements bilaterally intact.  Sclerae nonicteric, noninjected. MOUTH:  Oral mucosa dry.  No lesions.  No sores. PHARYNX:  Clear.  No erythema.  No exudate. NECK:  Negative  for JVD.  Negative for thyromegaly.  Negative for lymphadenopathy. HEART:  Regular rate and rhythm at 80 beats per minute without murmur, ectopy, or gallops.  No lateral PMI.  No thrills.  LUNGS:  Decreased breath sounds bilaterally with BiPAP on.  No wheezes.  No rhonchi.  No rales.  No increase work of breathing.  No tactile fremitus.  The patient is wearing BiPAP for obstructive sleep apnea, not for dyspnea. ABDOMEN:  Hugely morbidly obese, unable to evaluate for organomegaly. Positive bowel sounds.  No tenderness.  No distention. EXTREMITIES:  Negative for cyanosis, clubbing, or edema.  The patient has diminished dorsalis pedis and popliteal pulses bilaterally.  No carotid bruits bilaterally. NEUROLOGIC:  The patient is moving all extremities.  Cranial nerves II through XII grossly intact.  Motor and sensory intact.  LABORATORY DATA:  WBC 22.7, hemoglobin 13.2, hematocrit 40.1, platelets 141.  Sodium 139, potassium 3.5, chloride 101, CO2 of 25, BUN 20, creatinine is 1.0, glucose 150.  Alk phos 66, AST 18, ALT 17, total protein 7.  Urinalysis positive for UTI.  Two-view chest shows cardiomegaly.  Lactic acid 3.5, albumin 3.7, calcium 9.2.  Chest x-ray shows mild interstitial edema.  ASSESSMENT: 1. Sepsis with leukocytosis, shaking chills and fever. 2. Urinary tract infection. 3. Lactic acidosis, likely due to urinary tract infection, but check     acute abdomen to make sure no intra-abdominal source for this     elevated lactic acid. 4. Chronic obstructive pulmonary disease with a history of bronchitis     and hypercapnia.  We will monitor the patient and give her     breathing treatments as necessary. 5. Chronic left arm pain for which the patient will be given her home     medications.  PLAN: 1. Admit. 2. IV fluids. 3. IV antibiotics. 4. Check blood cultures x2. 5. Continue home meds as possible. 6. Check two-view abdomen to rule out abdominal pathology for the      patient's elevated lactic acid. 7. p.r.n. nebulized treatments. 8. Pain control.  I spent 50 minutes on this admission.          ______________________________ Fran Lowes, DO     AS/MEDQ  D:  05/19/2011  T:  05/19/2011  Job:  [id number]  Electronically Signed by Fran Lowes DO on 06/11/2011 10:40:42 PM

## 2011-07-03 ENCOUNTER — Ambulatory Visit (HOSPITAL_COMMUNITY)
Admission: RE | Admit: 2011-07-03 | Discharge: 2011-07-03 | Disposition: A | Discharge status: Home / Self Care | Payer: Medicare Other | Source: Ambulatory Visit | Attending: Family Medicine | Admitting: Family Medicine

## 2011-07-03 DIAGNOSIS — M79609 Pain in unspecified limb: Secondary | ICD-10-CM

## 2011-07-03 DIAGNOSIS — M7989 Other specified soft tissue disorders: Secondary | ICD-10-CM

## 2012-01-15 ENCOUNTER — Encounter (INDEPENDENT_AMBULATORY_CARE_PROVIDER_SITE_OTHER): Payer: Self-pay | Admitting: General Surgery

## 2012-02-05 ENCOUNTER — Ambulatory Visit (INDEPENDENT_AMBULATORY_CARE_PROVIDER_SITE_OTHER): Payer: Medicare Other | Admitting: General Surgery

## 2013-03-03 ENCOUNTER — Encounter (HOSPITAL_BASED_OUTPATIENT_CLINIC_OR_DEPARTMENT_OTHER): Payer: Medicare Other | Attending: General Surgery

## 2013-03-03 DIAGNOSIS — L97509 Non-pressure chronic ulcer of other part of unspecified foot with unspecified severity: Secondary | ICD-10-CM | POA: Insufficient documentation

## 2013-03-03 DIAGNOSIS — G589 Mononeuropathy, unspecified: Secondary | ICD-10-CM | POA: Insufficient documentation

## 2013-03-03 DIAGNOSIS — L84 Corns and callosities: Secondary | ICD-10-CM | POA: Insufficient documentation

## 2013-03-10 ENCOUNTER — Other Ambulatory Visit (HOSPITAL_BASED_OUTPATIENT_CLINIC_OR_DEPARTMENT_OTHER): Payer: Self-pay | Admitting: General Surgery

## 2013-03-10 DIAGNOSIS — R52 Pain, unspecified: Secondary | ICD-10-CM

## 2013-03-16 ENCOUNTER — Ambulatory Visit (HOSPITAL_COMMUNITY)
Admission: RE | Admit: 2013-03-16 | Discharge: 2013-03-16 | Disposition: A | Discharge status: Home / Self Care | Payer: Medicare Other | Source: Ambulatory Visit | Attending: General Surgery | Admitting: General Surgery

## 2013-03-16 DIAGNOSIS — L02619 Cutaneous abscess of unspecified foot: Secondary | ICD-10-CM | POA: Insufficient documentation

## 2013-03-16 DIAGNOSIS — X58XXXA Exposure to other specified factors, initial encounter: Secondary | ICD-10-CM | POA: Insufficient documentation

## 2013-03-16 DIAGNOSIS — R52 Pain, unspecified: Secondary | ICD-10-CM

## 2013-03-16 DIAGNOSIS — S91109A Unspecified open wound of unspecified toe(s) without damage to nail, initial encounter: Secondary | ICD-10-CM | POA: Insufficient documentation

## 2013-03-16 DIAGNOSIS — L03039 Cellulitis of unspecified toe: Secondary | ICD-10-CM | POA: Insufficient documentation

## 2013-03-16 MED ORDER — GADOBENATE DIMEGLUMINE 529 MG/ML IV SOLN
10.0000 mL | Freq: Once | INTRAVENOUS | Status: AC | PRN
  Administered 2013-03-16: 10 mL via INTRAVENOUS

## 2013-03-23 ENCOUNTER — Encounter (HOSPITAL_BASED_OUTPATIENT_CLINIC_OR_DEPARTMENT_OTHER): Payer: Medicare Other | Attending: General Surgery

## 2013-03-23 DIAGNOSIS — L97509 Non-pressure chronic ulcer of other part of unspecified foot with unspecified severity: Secondary | ICD-10-CM | POA: Insufficient documentation

## 2013-03-23 DIAGNOSIS — G5790 Unspecified mononeuropathy of unspecified lower limb: Secondary | ICD-10-CM | POA: Insufficient documentation

## 2013-03-23 DIAGNOSIS — L84 Corns and callosities: Secondary | ICD-10-CM | POA: Insufficient documentation

## 2013-04-27 ENCOUNTER — Encounter (HOSPITAL_BASED_OUTPATIENT_CLINIC_OR_DEPARTMENT_OTHER): Payer: Medicare Other | Attending: General Surgery

## 2013-04-27 DIAGNOSIS — G589 Mononeuropathy, unspecified: Secondary | ICD-10-CM | POA: Insufficient documentation

## 2013-04-27 DIAGNOSIS — L97509 Non-pressure chronic ulcer of other part of unspecified foot with unspecified severity: Secondary | ICD-10-CM | POA: Insufficient documentation

## 2013-04-27 DIAGNOSIS — L84 Corns and callosities: Secondary | ICD-10-CM | POA: Insufficient documentation

## 2013-05-25 ENCOUNTER — Encounter (HOSPITAL_BASED_OUTPATIENT_CLINIC_OR_DEPARTMENT_OTHER): Payer: Medicare Other | Attending: General Surgery

## 2013-05-25 DIAGNOSIS — L84 Corns and callosities: Secondary | ICD-10-CM | POA: Insufficient documentation

## 2013-05-25 DIAGNOSIS — G5790 Unspecified mononeuropathy of unspecified lower limb: Secondary | ICD-10-CM | POA: Insufficient documentation

## 2013-05-25 DIAGNOSIS — L97509 Non-pressure chronic ulcer of other part of unspecified foot with unspecified severity: Secondary | ICD-10-CM | POA: Insufficient documentation

## 2013-06-29 ENCOUNTER — Encounter (HOSPITAL_BASED_OUTPATIENT_CLINIC_OR_DEPARTMENT_OTHER): Payer: Medicare Other | Attending: General Surgery

## 2013-06-29 DIAGNOSIS — L84 Corns and callosities: Secondary | ICD-10-CM | POA: Insufficient documentation

## 2013-06-29 DIAGNOSIS — G5790 Unspecified mononeuropathy of unspecified lower limb: Secondary | ICD-10-CM | POA: Insufficient documentation

## 2013-06-29 DIAGNOSIS — L97509 Non-pressure chronic ulcer of other part of unspecified foot with unspecified severity: Secondary | ICD-10-CM | POA: Insufficient documentation

## 2013-09-25 ENCOUNTER — Emergency Department (HOSPITAL_COMMUNITY): Payer: Medicare HMO

## 2013-09-25 ENCOUNTER — Emergency Department (HOSPITAL_COMMUNITY)
Admission: EM | Admit: 2013-09-25 | Discharge: 2013-09-25 | Disposition: A | Discharge status: Home / Self Care | Payer: Medicare HMO | Attending: Emergency Medicine | Admitting: Emergency Medicine

## 2013-09-25 ENCOUNTER — Encounter (HOSPITAL_COMMUNITY): Payer: Self-pay | Admitting: Emergency Medicine

## 2013-09-25 DIAGNOSIS — Z8542 Personal history of malignant neoplasm of other parts of uterus: Secondary | ICD-10-CM | POA: Insufficient documentation

## 2013-09-25 DIAGNOSIS — Z96649 Presence of unspecified artificial hip joint: Secondary | ICD-10-CM | POA: Insufficient documentation

## 2013-09-25 DIAGNOSIS — E079 Disorder of thyroid, unspecified: Secondary | ICD-10-CM | POA: Insufficient documentation

## 2013-09-25 DIAGNOSIS — IMO0002 Reserved for concepts with insufficient information to code with codable children: Secondary | ICD-10-CM | POA: Insufficient documentation

## 2013-09-25 DIAGNOSIS — M4800 Spinal stenosis, site unspecified: Secondary | ICD-10-CM

## 2013-09-25 DIAGNOSIS — Z8669 Personal history of other diseases of the nervous system and sense organs: Secondary | ICD-10-CM | POA: Insufficient documentation

## 2013-09-25 DIAGNOSIS — M1711 Unilateral primary osteoarthritis, right knee: Secondary | ICD-10-CM

## 2013-09-25 DIAGNOSIS — M792 Neuralgia and neuritis, unspecified: Secondary | ICD-10-CM

## 2013-09-25 DIAGNOSIS — Z791 Long term (current) use of non-steroidal anti-inflammatories (NSAID): Secondary | ICD-10-CM | POA: Insufficient documentation

## 2013-09-25 DIAGNOSIS — IMO0001 Reserved for inherently not codable concepts without codable children: Secondary | ICD-10-CM | POA: Insufficient documentation

## 2013-09-25 DIAGNOSIS — M25559 Pain in unspecified hip: Secondary | ICD-10-CM | POA: Insufficient documentation

## 2013-09-25 DIAGNOSIS — Z79899 Other long term (current) drug therapy: Secondary | ICD-10-CM | POA: Insufficient documentation

## 2013-09-25 DIAGNOSIS — R209 Unspecified disturbances of skin sensation: Secondary | ICD-10-CM | POA: Insufficient documentation

## 2013-09-25 DIAGNOSIS — M1710 Unilateral primary osteoarthritis, unspecified knee: Secondary | ICD-10-CM | POA: Insufficient documentation

## 2013-09-25 DIAGNOSIS — I10 Essential (primary) hypertension: Secondary | ICD-10-CM | POA: Insufficient documentation

## 2013-09-25 MED ORDER — GABAPENTIN 300 MG PO CAPS: ORAL_CAPSULE | ORAL | Status: DC

## 2013-09-25 MED ORDER — HYDROCODONE-ACETAMINOPHEN 5-325 MG PO TABS: 1.0000 | ORAL_TABLET | ORAL | Status: DC | PRN

## 2013-09-25 NOTE — Discharge Instructions (Signed)
Spinal Stenosis Spinal stenosis is an abnormal narrowing of the canals of your spine (vertebrae). CAUSES  Spinal stenosis is caused by areas of bone pushing into the central canals of your vertebrae. This condition can be present at birth (congenital). It also may be caused by arthritic deterioration of your vertebrae (spinal degeneration).  SYMPTOMS   Pain that is generally worse with activities, particularly standing and walking.  Numbness, tingling, hot or cold sensations, weakness, or weariness in your legs.  Frequent episodes of falling.  A foot-slapping gait that leads to muscle weakness. DIAGNOSIS  Spinal stenosis is diagnosed with the use of magnetic resonance imaging (MRI) or computed tomography (CT). TREATMENT  Initial therapy for spinal stenosis focuses on the management of the pain and other symptoms associated with the condition. These therapies include:  Practicing postural changes to lessen pressure on your nerves.  Exercises to strengthen the core of your body.  Loss of excess body weight.  The use of nonsteroidal anti-inflammatory medications to reduce swelling and inflammation in your nerves. When therapies to manage pain are not successful, surgery to treat spinal stenosis may be recommended. This surgery involves removing excess bone, which puts pressure on your nerve roots. During this surgery (laminectomy), the posterior boney arch (lamina) and excess bone around the facet joints are removed. Document Released: 11/30/2003 Document Revised: 01/04/2013 Document Reviewed: 12/18/2012 [facility] Patient Information 2014 [place].  Neuropathic Pain We often think that pain has a physical cause. If we get rid of the cause, the pain should go away. Nerves themselves can also cause pain. It is called neuropathic pain, which means nerve abnormality. It may be difficult for the patients who have it and for the treating caregivers. Pain is usually described as acute  (short-lived) or chronic (long-lasting). Acute pain is related to the physical sensations caused by an injury. It can last from a few seconds to many weeks, but it usually goes away when normal healing occurs. Chronic pain lasts beyond the typical healing time. With neuropathic pain, the nerve fibers themselves may be damaged or injured. They then send incorrect signals to other pain centers. The pain you feel is real, but the cause is not easy to find.  CAUSES  Chronic pain can result from diseases, such as diabetes and shingles (an infection related to chickenpox), or from trauma, surgery, or amputation. It can also happen without any known injury or disease. The nerves are sending pain messages, even though there is no identifiable cause for such messages.   Other common causes of neuropathy include diabetes, phantom limb pain, or Regional Pain Syndrome (RPS).  As with all forms of chronic back pain, if neuropathy is not correctly treated, there can be a number of associated problems that lead to a downward cycle for the patient. These include depression, sleeplessness, feelings of fear and anxiety, limited social interaction and inability to do normal daily activities or work.  The most dramatic and mysterious example of neuropathic pain is called "phantom limb syndrome." This occurs when an arm or a leg has been removed because of illness or injury. The brain still gets pain messages from the nerves that originally carried impulses from the missing limb. These nerves now seem to misfire and cause troubling pain.  Neuropathic pain often seems to have no cause. It responds poorly to standard pain treatment. Neuropathic pain can occur after:  Shingles (herpes zoster virus infection).  A lasting burning sensation of the skin, caused usually by injury to a peripheral  nerve.  Peripheral neuropathy which is widespread nerve damage, often caused by diabetes or alcoholism.  Phantom limb pain following  an amputation.  Facial nerve problems (trigeminal neuralgia).  Multiple sclerosis.  Reflex sympathetic dystrophy.  Pain which comes with cancer and cancer chemotherapy.  Entrapment neuropathy such as when pressure is put on a nerve such as in carpal tunnel syndrome.  Back, leg, and hip problems (sciatica).  Spine or back surgery.  HIV Infection or AIDS where nerves are infected by viruses. Your caregiver can explain items in the above list which may apply to you. SYMPTOMS  Characteristics of neuropathic pain are:  Severe, sharp, electric shock-like, shooting, lightening-like, knife-like.  Pins and needles sensation.  Deep burning, deep cold, or deep ache.  Persistent numbness, tingling, or weakness.  Pain resulting from light touch or other stimulus that would not usually cause pain.  Increased sensitivity to something that would normally cause pain, such as a pinprick. Pain may persist for months or years following the healing of damaged tissues. When this happens, pain signals no longer sound an alarm about current injuries or injuries about to happen. Instead, the alarm system itself is not working correctly.  Neuropathic pain may get worse instead of better over time. For some people, it can lead to serious disability. It is important to be aware that severe injury in a limb can occur without a proper, protective pain response.Burns, cuts, and other injuries may go unnoticed. Without proper treatment, these injuries can become infected or lead to further disability. Take any injury seriously, and consult your caregiver for treatment. DIAGNOSIS  When you have a pain with no known cause, your caregiver will probably ask some specific questions:   Do you have any other conditions, such as diabetes, shingles, multiple sclerosis, or HIV infection?  How would you describe your pain? (Neuropathic pain is often described as shooting, stabbing, burning, or searing.)  Is your pain  worse at any time of the day? (Neuropathic pain is usually worse at night.)  Does the pain seem to follow a certain physical pathway?  Does the pain come from an area that has missing or injured nerves? (An example would be phantom limb pain.)  Is the pain triggered by minor things such as rubbing against the sheets at night? These questions often help define the type of pain involved. Once your caregiver knows what is happening, treatment can begin. Anticonvulsant, antidepressant drugs, and various pain relievers seem to work in some cases. If another condition, such as diabetes is involved, better management of that disorder may relieve the neuropathic pain.  TREATMENT  Neuropathic pain is frequently long-lasting and tends not to respond to treatment with narcotic type pain medication. It may respond well to other drugs such as antiseizure and antidepressant medications. Usually, neuropathic problems do not completely go away, but partial improvement is often possible with proper treatment. Your caregivers have large numbers of medications available to treat you. Do not be discouraged if you do not get immediate relief. Sometimes different medications or a combination of medications will be tried before you receive the results you are hoping for. See your caregiver if you have pain that seems to be coming from nowhere and does not go away. Help is available.  SEEK IMMEDIATE MEDICAL CARE IF:   There is a sudden change in the quality of your pain, especially if the change is on only one side of the body.  You notice changes of the skin, such as redness,  black or purple discoloration, swelling, or an ulcer.  You cannot move the affected limbs. Document Released: 06/06/2004 Document Revised: 12/02/2011 Document Reviewed: 06/06/2004 [facility] Patient Information 2014 [place].

## 2013-09-25 NOTE — ED Notes (Signed)
MD at bedside. 

## 2013-09-25 NOTE — ED Provider Notes (Signed)
CSN: [id number]     Arrival date & time 09/25/13  1315 History   First MD Initiated Contact with Patient 09/25/13 1425     Chief Complaint  Patient presents with  . Leg Pain    HPI Patient presents with pain primarily to the right hip and right knee.  The pain is only present when she tries to walk and stand on her leg.  She is lying in the bed moving her leg both hip and knee she did not experience pain.  She also has some numbness to both feet which been present for many years.  Review of patient's records reveals severe degenerative disease and severe spinal stenosis from CT scan dated 2004.  Patient denies any recent falls.  She she was recently placed on a small dose of tapering steroids.  She's not having relief from pain since then.  She also took 2 doses of 4 mg ibuprofen.  Denies any fever chills.  Patient denies any renal disease. Past Medical History  Diagnosis Date  . Hypertension   . Sleep apnea   . Thyroid disease   . Uterine cancer    Past Surgical History  Procedure Laterality Date  . Abdominal hysterectomy    . Back surgery    . Total hip arthroplasty     History reviewed. No pertinent family history. History  Substance Use Topics  . Smoking status: Never Smoker   . Smokeless tobacco: Not on file  . Alcohol Use: No   OB History   Grav Para Term Preterm Abortions TAB SAB Ect Mult Living                 Review of Systems  Constitutional: Negative for fever and chills.  Cardiovascular: Negative for leg swelling.  All other systems reviewed and are negative.    Allergies  Ciprofloxacin hcl; Cephalexin; Crestor; and Zocor  Home Medications   Current Outpatient Rx  Name  Route  Sig  Dispense  Refill  . diclofenac (VOLTAREN) 75 MG EC tablet   Oral   Take 75 mg by mouth 2 (two) times daily.           . furosemide (LASIX) 40 MG tablet   Oral   Take 40 mg by mouth daily.           Marland Kitchen levothyroxine (SYNTHROID, LEVOTHROID) 137 MCG tablet   Oral   Take  137 mcg by mouth daily.           Marland Kitchen LORazepam (ATIVAN) 0.5 MG tablet   Oral   Take 0.5 mg by mouth every 8 (eight) hours.         . Multiple Vitamins-Minerals (PRESERVISION AREDS PO)   Oral   Take 2 tablets by mouth daily.           Marland Kitchen olmesartan (BENICAR) 40 MG tablet   Oral   Take 40 mg by mouth daily.           Marland Kitchen omeprazole (PRILOSEC) 20 MG capsule   Oral   Take 20 mg by mouth daily.           . potassium chloride SA (K-DUR,KLOR-CON) 20 MEQ tablet   Oral   Take 80 mEq by mouth daily.          . predniSONE (DELTASONE) 10 MG tablet   Oral   Take 10 mg by mouth daily with breakfast. Started 09/22/12 10 mg three times daily for 3 days. 10 mg twice daily for  3 days. 10 mg daily for 3 days         . gabapentin (NEURONTIN) 300 MG capsule      Take 1 tablet on day 1 Take 2 tablets on day 2 Take 3 tablets on day 3 and continue on 3 tablets daily until directed by physician   90 capsule   0   . HYDROcodone-acetaminophen (NORCO/VICODIN) 5-325 MG per tablet   Oral   Take 1-2 tablets by mouth every 4 (four) hours as needed.   30 tablet   0    BP 193/60  Pulse 55  Temp(Src) 97.9 F (36.6 C) (Oral)  Resp 20  SpO2 97% Physical Exam  Nursing note and vitals reviewed. Constitutional: She is oriented to person, place, and time. She appears well-developed and well-nourished. No distress.  HENT:  Head: Normocephalic and atraumatic.  Eyes: Pupils are equal, round, and reactive to light.  Neck: Normal range of motion.  Cardiovascular: Normal rate and intact distal pulses.   Pulmonary/Chest: No respiratory distress.  Abdominal: Normal appearance. She exhibits no distension.  Musculoskeletal:       Right shoulder: She exhibits normal range of motion, no tenderness, no bony tenderness, no deformity and no pain.       Right hip: She exhibits normal range of motion, no tenderness and no deformity.       Right knee: She exhibits normal range of motion. No tenderness  found.  Neurological: She is alert and oriented to person, place, and time. No cranial nerve deficit.  Skin: Skin is warm and dry. No rash noted.  Psychiatric: She has a normal mood and affect. Her behavior is normal.    ED Course  Procedures (including critical care time) Labs Review Labs Reviewed - No data to display Imaging Review Dg Hip Complete Right  09/25/2013   CLINICAL DATA:  Unable to stand or bear weight on leg for 5 days. No known injury.  EXAM: RIGHT HIP - COMPLETE 2+ VIEW  COMPARISON:  Right knee radiographs 09/25/2013  FINDINGS: There are postoperative changes of lower lumbar spine fusion. Left hip total arthroplasty is noted. The femoral stem component is not completely included on this view. There is mild joint space narrowing and lateral spur formation of the right hip. Right hip is located. No acute fracture is identified. Visualized bowel gas pattern nonobstructive.  IMPRESSION: Mild osteoarthritis of the right hip. Postsurgical changes of the lumbar spine and left hip arthroplasty. No acute bony abnormality.   Electronically Signed   By: Curlene Dolphin M.D.   On: 09/25/2013 15:31   Dg Knee Complete 4 Views Right  09/25/2013   CLINICAL DATA:  Right leg pain. Unable to stand or apply pressure to the right knee for the past 5 days.  EXAM: RIGHT KNEE - COMPLETE 4+ VIEW  COMPARISON:  No priors.  FINDINGS: Multiple views of the right knee demonstrate no acute displaced fracture, subluxation, dislocation, or soft tissue abnormality. Severe joint space narrowing, subchondral sclerosis, subchondral cyst formation and osteophyte formation is noted throughout the knee joint, most severe in the patellofemoral compartment, compatible with advanced osteoarthritis.  IMPRESSION: 1. No acute radiographic abnormality of the right knee. 2. Severe tricompartmental osteoarthritis, most pronounced in the patellofemoral compartment.   Electronically Signed   By: Vinnie Langton M.D.   On: 09/25/2013 15:34      After reviewing the patient's records and then exam I suspect she has a neuropathic cause of her pain.  Plan: We'll do plain  film x-rays to rule out occult fracture.  If negative will start on Neurontin along with stronger pain medicines such as hydrocodone.  MDM   1. Spinal stenosis   2. Neuropathic pain   3. Degenerative arthritis of right knee        Dot Lanes, MD 09/25/13 2118

## 2013-09-25 NOTE — ED Notes (Signed)
She c/o right hip/leg/knee pain since this Tues.  Her PCP prescribed her prednisone; and her sx continue to worsen.  She is in no distress.

## 2013-09-27 ENCOUNTER — Emergency Department (HOSPITAL_COMMUNITY): Payer: Medicare HMO

## 2013-09-27 ENCOUNTER — Observation Stay (HOSPITAL_COMMUNITY)
Admission: EM | Admit: 2013-09-27 | Discharge: 2013-09-29 | Disposition: A | Discharge status: Skilled Nursing Facility | Payer: Medicare HMO | Attending: Family Medicine | Admitting: Family Medicine

## 2013-09-27 ENCOUNTER — Encounter (HOSPITAL_COMMUNITY): Payer: Self-pay | Admitting: Emergency Medicine

## 2013-09-27 DIAGNOSIS — I509 Heart failure, unspecified: Secondary | ICD-10-CM | POA: Insufficient documentation

## 2013-09-27 DIAGNOSIS — G4733 Obstructive sleep apnea (adult) (pediatric): Secondary | ICD-10-CM

## 2013-09-27 DIAGNOSIS — S92301A Fracture of unspecified metatarsal bone(s), right foot, initial encounter for closed fracture: Secondary | ICD-10-CM

## 2013-09-27 DIAGNOSIS — S92901S Unspecified fracture of right foot, sequela: Secondary | ICD-10-CM

## 2013-09-27 DIAGNOSIS — M19079 Primary osteoarthritis, unspecified ankle and foot: Secondary | ICD-10-CM | POA: Insufficient documentation

## 2013-09-27 DIAGNOSIS — M899 Disorder of bone, unspecified: Secondary | ICD-10-CM | POA: Insufficient documentation

## 2013-09-27 DIAGNOSIS — I129 Hypertensive chronic kidney disease with stage 1 through stage 4 chronic kidney disease, or unspecified chronic kidney disease: Secondary | ICD-10-CM | POA: Insufficient documentation

## 2013-09-27 DIAGNOSIS — J3489 Other specified disorders of nose and nasal sinuses: Secondary | ICD-10-CM | POA: Insufficient documentation

## 2013-09-27 DIAGNOSIS — I10 Essential (primary) hypertension: Secondary | ICD-10-CM

## 2013-09-27 DIAGNOSIS — I2789 Other specified pulmonary heart diseases: Secondary | ICD-10-CM | POA: Insufficient documentation

## 2013-09-27 DIAGNOSIS — J40 Bronchitis, not specified as acute or chronic: Secondary | ICD-10-CM | POA: Insufficient documentation

## 2013-09-27 DIAGNOSIS — M7730 Calcaneal spur, unspecified foot: Secondary | ICD-10-CM | POA: Insufficient documentation

## 2013-09-27 DIAGNOSIS — Z8542 Personal history of malignant neoplasm of other parts of uterus: Secondary | ICD-10-CM | POA: Insufficient documentation

## 2013-09-27 DIAGNOSIS — R296 Repeated falls: Secondary | ICD-10-CM | POA: Insufficient documentation

## 2013-09-27 DIAGNOSIS — R0981 Nasal congestion: Secondary | ICD-10-CM | POA: Diagnosis present

## 2013-09-27 DIAGNOSIS — S92309A Fracture of unspecified metatarsal bone(s), unspecified foot, initial encounter for closed fracture: Principal | ICD-10-CM | POA: Insufficient documentation

## 2013-09-27 DIAGNOSIS — M949 Disorder of cartilage, unspecified: Secondary | ICD-10-CM

## 2013-09-27 DIAGNOSIS — S92901A Unspecified fracture of right foot, initial encounter for closed fracture: Secondary | ICD-10-CM

## 2013-09-27 DIAGNOSIS — N189 Chronic kidney disease, unspecified: Secondary | ICD-10-CM

## 2013-09-27 DIAGNOSIS — I5032 Chronic diastolic (congestive) heart failure: Secondary | ICD-10-CM

## 2013-09-27 DIAGNOSIS — R062 Wheezing: Secondary | ICD-10-CM | POA: Insufficient documentation

## 2013-09-27 DIAGNOSIS — Z79899 Other long term (current) drug therapy: Secondary | ICD-10-CM | POA: Insufficient documentation

## 2013-09-27 DIAGNOSIS — M25559 Pain in unspecified hip: Secondary | ICD-10-CM | POA: Insufficient documentation

## 2013-09-27 DIAGNOSIS — R0789 Other chest pain: Secondary | ICD-10-CM | POA: Insufficient documentation

## 2013-09-27 LAB — POCT I-STAT, CHEM 8
BUN: 39 mg/dL — ABNORMAL HIGH (ref 6–23)
CALCIUM ION: 1.13 mmol/L (ref 1.13–1.30)
CHLORIDE: 108 meq/L (ref 96–112)
Creatinine, Ser: 1.8 mg/dL — ABNORMAL HIGH (ref 0.50–1.10)
GLUCOSE: 119 mg/dL — AB (ref 70–99)
HEMATOCRIT: 42 % (ref 36.0–46.0)
Hemoglobin: 14.3 g/dL (ref 12.0–15.0)
Potassium: 4.5 mEq/L (ref 3.7–5.3)
Sodium: 142 mEq/L (ref 137–147)
TCO2: 24 mmol/L (ref 0–100)

## 2013-09-27 LAB — URINALYSIS, ROUTINE W REFLEX MICROSCOPIC
BILIRUBIN URINE: NEGATIVE
Glucose, UA: NEGATIVE mg/dL
Hgb urine dipstick: NEGATIVE
KETONES UR: NEGATIVE mg/dL
LEUKOCYTES UA: NEGATIVE
NITRITE: NEGATIVE
PROTEIN: NEGATIVE mg/dL
Specific Gravity, Urine: 1.02 (ref 1.005–1.030)
Urobilinogen, UA: 0.2 mg/dL (ref 0.0–1.0)
pH: 5.5 (ref 5.0–8.0)

## 2013-09-27 MED ORDER — LORAZEPAM 0.5 MG PO TABS
0.5000 mg | ORAL_TABLET | Freq: Every day | ORAL | Status: DC
  Administered 2013-09-27 – 2013-09-28 (×2): 0.5 mg via ORAL
  Filled 2013-09-27 (×2): qty 1

## 2013-09-27 MED ORDER — PANTOPRAZOLE SODIUM 40 MG PO TBEC
40.0000 mg | DELAYED_RELEASE_TABLET | Freq: Every day | ORAL | Status: DC
  Administered 2013-09-28 – 2013-09-29 (×2): 40 mg via ORAL
  Filled 2013-09-27 (×2): qty 1

## 2013-09-27 MED ORDER — LEVOTHYROXINE SODIUM 137 MCG PO TABS
137.0000 ug | ORAL_TABLET | Freq: Every day | ORAL | Status: DC
  Administered 2013-09-28 – 2013-09-29 (×2): 137 ug via ORAL
  Filled 2013-09-27 (×3): qty 1

## 2013-09-27 MED ORDER — POTASSIUM CHLORIDE CRYS ER 20 MEQ PO TBCR
40.0000 meq | EXTENDED_RELEASE_TABLET | Freq: Every morning | ORAL | Status: DC
  Administered 2013-09-28 – 2013-09-29 (×2): 40 meq via ORAL
  Filled 2013-09-27 (×2): qty 2

## 2013-09-27 MED ORDER — IRBESARTAN 300 MG PO TABS
300.0000 mg | ORAL_TABLET | Freq: Every day | ORAL | Status: DC
  Administered 2013-09-28 – 2013-09-29 (×2): 300 mg via ORAL
  Filled 2013-09-27 (×2): qty 1

## 2013-09-27 MED ORDER — GABAPENTIN 300 MG PO CAPS
900.0000 mg | ORAL_CAPSULE | Freq: Every morning | ORAL | Status: DC
  Administered 2013-09-28 – 2013-09-29 (×2): 900 mg via ORAL
  Filled 2013-09-27 (×2): qty 3

## 2013-09-27 MED ORDER — SODIUM CHLORIDE 0.9 % IJ SOLN: 3.0000 mL | INTRAMUSCULAR | Status: DC | PRN

## 2013-09-27 MED ORDER — SODIUM CHLORIDE 0.9 % IV SOLN: 250.0000 mL | INTRAVENOUS | Status: DC | PRN

## 2013-09-27 MED ORDER — FUROSEMIDE 40 MG PO TABS
40.0000 mg | ORAL_TABLET | Freq: Every morning | ORAL | Status: DC
  Administered 2013-09-28 – 2013-09-29 (×2): 40 mg via ORAL
  Filled 2013-09-27 (×2): qty 1

## 2013-09-27 MED ORDER — HYDROCODONE-ACETAMINOPHEN 5-325 MG PO TABS
1.0000 | ORAL_TABLET | ORAL | Status: DC | PRN
  Administered 2013-09-28 (×2): 2 via ORAL
  Administered 2013-09-29 (×2): 1 via ORAL
  Filled 2013-09-27: qty 1
  Filled 2013-09-27 (×2): qty 2
  Filled 2013-09-27: qty 1

## 2013-09-27 MED ORDER — OCUVITE PO TABS
1.0000 | ORAL_TABLET | Freq: Every day | ORAL | Status: DC
  Administered 2013-09-28 – 2013-09-29 (×2): 1 via ORAL
  Filled 2013-09-27 (×2): qty 1

## 2013-09-27 MED ORDER — SODIUM CHLORIDE 0.9 % IJ SOLN
3.0000 mL | Freq: Two times a day (BID) | INTRAMUSCULAR | Status: DC
  Administered 2013-09-28: 3 mL via INTRAVENOUS

## 2013-09-27 MED ORDER — HEPARIN SODIUM (PORCINE) 5000 UNIT/ML IJ SOLN
5000.0000 [IU] | Freq: Three times a day (TID) | INTRAMUSCULAR | Status: DC
  Administered 2013-09-28 – 2013-09-29 (×4): 5000 [IU] via SUBCUTANEOUS
  Filled 2013-09-27 (×7): qty 1

## 2013-09-27 NOTE — H&P (Signed)
Hospitalist Admission History and Physical  Patient name: Judy Ramirez Medical record number: [id number] Date of birth: 05-06-1932 Age: 80 y.o. Gender: female  Primary Care Provider: Woody Seller, MD  Chief Complaint: fall, metatarsal fracture, morbid obesity, inability to ambulate  History of Present Illness:This is a 80 y.o. year old female with significant past medical history of morbid obesity presenting with R foot metatarsal fractures s/p fall. Pt states that she was trying to get out of bed this am when she accidentally fell, striking R foot against the wall. Pt states that she has had persistent pain in R foot since this point. Pt uses rolling walker to ambulate at baseline. Has been unable to ambulate since R foot injury. Pt noted to have been seen in ER 2 days ago for R hip pain.  R hip xrays negative at the time for fracture or dislocation. Did show arthritis. Was placed on neurontin.  Pt presented to the ER today 2/2 to foot pain s/p fall. Had R foot xray the showed mildly displaced 2nd through 5th metatarsal neck fractures. Pt was initially evaluated with plan for d/c home and outpt PT/OT follow up. However, pt was unable to ambulate. Recommendation is now for inpt admission with likely inpt rehab pending PT/OT eval. Ortho was consulted with recs for nonoperative management.    Patient Active Problem List   Diagnosis Date Noted  . Foot fracture, right 09/27/2013  . PERIODIC LIMB MOVEMENT DISORDER 11/06/2010  . OBSTRUCTIVE SLEEP APNEA 08/30/2010  . HYPERTENSION, UNSPECIFIED 08/28/2010  . PULMONARY HYPERTENSION 08/28/2010  . SHORTNESS OF BREATH 08/28/2010  . MORBID OBESITY 08/09/2010  . BRADYCARDIA 08/09/2010  . CHRONIC DIASTOLIC HEART FAILURE 15/40/0867  . SNORING 08/09/2010   Past Medical History: Past Medical History  Diagnosis Date  . Hypertension   . Sleep apnea   . Thyroid disease   . Uterine cancer     Past Surgical History: Past Surgical History   Procedure Laterality Date  . Abdominal hysterectomy    . Back surgery    . Total hip arthroplasty    . Knee arthroscopy Bilateral     Social History: History   Social History  . Marital Status: Widowed    Spouse Name: N/A    Number of Children: N/A  . Years of Education: N/A   Social History Main Topics  . Smoking status: Never Smoker   . Smokeless tobacco: Never Used  . Alcohol Use: No  . Drug Use: No  . Sexual Activity: None   Other Topics Concern  . None   Social History Narrative  . None    Family History: History reviewed. No pertinent family history.  Allergies: Allergies  Allergen Reactions  . Ciprofloxacin Hcl Diarrhea and Nausea And Vomiting  . Crestor [Rosuvastatin Calcium] Other (See Comments)    "didn't feel good"  . Zocor [Simvastatin - High Dose] Other (See Comments)    "didn't feel good"  . Cephalexin Diarrhea and Nausea And Vomiting    Current Facility-Administered Medications  Medication Dose Route Frequency Provider Last Rate Last Dose  . 0.9 %  sodium chloride infusion  250 mL Intravenous PRN Shanda Howells, MD      . Derrill Memo ON 09/28/2013] furosemide (LASIX) tablet 40 mg  40 mg Oral q morning - 10a Shanda Howells, MD      . Derrill Memo ON 09/28/2013] gabapentin (NEURONTIN) capsule 900 mg  900 mg Oral q morning - 10a Shanda Howells, MD      . heparin injection  5,000 Units  5,000 Units Subcutaneous Q8H Shanda Howells, MD      . HYDROcodone-acetaminophen (NORCO/VICODIN) 5-325 MG per tablet 1-2 tablet  1-2 tablet Oral Q4H PRN Shanda Howells, MD      . Derrill Memo ON 09/28/2013] irbesartan (AVAPRO) tablet 300 mg  300 mg Oral Daily Shanda Howells, MD      . Derrill Memo ON 09/28/2013] levothyroxine (SYNTHROID, LEVOTHROID) tablet 137 mcg  137 mcg Oral QAC breakfast Shanda Howells, MD      . LORazepam (ATIVAN) tablet 0.5 mg  0.5 mg Oral QHS Shanda Howells, MD      . Derrill Memo ON 09/28/2013] pantoprazole (PROTONIX) EC tablet 40 mg  40 mg Oral Daily Shanda Howells, MD      . Derrill Memo ON  09/28/2013] potassium chloride SA (K-DUR,KLOR-CON) CR tablet 40 mEq  40 mEq Oral q morning - 10a Shanda Howells, MD      . Derrill Memo ON 09/28/2013] PRESERVISION AREDS TABS 1 tablet  1 tablet Oral Daily Shanda Howells, MD      . sodium chloride 0.9 % injection 3 mL  3 mL Intravenous Q12H Shanda Howells, MD      . sodium chloride 0.9 % injection 3 mL  3 mL Intravenous PRN Shanda Howells, MD       Current Outpatient Prescriptions  Medication Sig Dispense Refill  . furosemide (LASIX) 40 MG tablet Take 40 mg by mouth every morning.       . gabapentin (NEURONTIN) 300 MG capsule Take 900 mg by mouth every morning.      Marland Kitchen HYDROcodone-acetaminophen (NORCO/VICODIN) 5-325 MG per tablet Take 1-2 tablets by mouth every 4 (four) hours as needed for moderate pain or severe pain.      Marland Kitchen levothyroxine (SYNTHROID, LEVOTHROID) 137 MCG tablet Take 137 mcg by mouth daily before breakfast.       . LORazepam (ATIVAN) 0.5 MG tablet Take 0.5 mg by mouth at bedtime.       . Multiple Vitamins-Minerals (PRESERVISION AREDS PO) Take 2 tablets by mouth every morning.       . olmesartan (BENICAR) 40 MG tablet Take 40 mg by mouth every morning.       Marland Kitchen omeprazole (PRILOSEC) 20 MG capsule Take 20 mg by mouth every morning.       . potassium chloride SA (K-DUR,KLOR-CON) 20 MEQ tablet Take 40 mEq by mouth every morning.       . predniSONE (DELTASONE) 10 MG tablet Take 10 mg by mouth daily with breakfast. Started 09/22/12 10 mg three times daily for 3 days. 10 mg twice daily for 3 days. 10 mg daily for 3 days       Review Of Systems: 12 point ROS negative except as noted above in HPI.  Physical Exam: Filed Vitals:   09/27/13 1444  BP: 189/56  Pulse: 59  Temp: 98.2 F (36.8 C)  Resp: 16    General: alert, cooperative and morbidly obese HEENT: PERRLA and extra ocular movement intact Heart: S1, S2 normal, no murmur, rub or gallop, regular rate and rhythm Lungs: clear to auscultation, no wheezes or rales and unlabored  breathing Abdomen: abdomen is soft without significant tenderness, masses, organomegaly or guarding Extremities: R LE swelling and TTP across dorsum of R foot, decreased ROM 2/2 pain , chronic L great toe ulcers. Stable.  Skin:no rashes, no ecchymoses Neurology: normal without focal findings, mental status, speech normal, alert and oriented x3, PERLA and reflexes normal and symmetric  Labs and Imaging: Lab Results  Component Value  Date/Time   NA 142 09/27/2013  4:07 PM   K 4.5 09/27/2013  4:07 PM   CL 108 09/27/2013  4:07 PM   CO2 31 05/20/2011  6:30 AM   BUN 39* 09/27/2013  4:07 PM   CREATININE 1.80* 09/27/2013  4:07 PM   GLUCOSE 119* 09/27/2013  4:07 PM   Lab Results  Component Value Date   WBC 19.0* 05/19/2011   HGB 14.3 09/27/2013   HCT 42.0 09/27/2013   MCV 85.8 05/19/2011   PLT 124* 05/19/2011    Dg Hip Complete Right  09/27/2013   CLINICAL DATA:  Fall.  Right hip pain.  EXAM: RIGHT HIP - COMPLETE 2+ VIEW  COMPARISON:  09/25/2013.  FINDINGS: Patient's panniculus projects over the hips. L4-L5 posterior lumbar interbody fusion and left total hip arthroplasty are present. Pelvic rings are intact. The right hip shows moderate osteoarthritis with marginal osteophytes. There is no fracture.  IMPRESSION: No acute abnormality or interval change.   Electronically Signed   By: Dereck Ligas M.D.   On: 09/27/2013 15:56   Dg Foot Complete Right  09/27/2013   CLINICAL DATA:  Pain and bruising of the right foot.  EXAM: RIGHT FOOT COMPLETE - 3+ VIEW  COMPARISON:  None.  FINDINGS: Second through 5th mildly displaced metatarsal neck fractures are present. Minimal intra-articular extension is present at the 5th MTP joint. Mild to moderate 1st MTP joint osteoarthritis is present. Diffuse osteopenia. Midfoot osteoarthritis is mild. Calcaneal spurs are present.  IMPRESSION: Mildly displaced 2nd through 5th metatarsal neck fractures.   Electronically Signed   By: Dereck Ligas M.D.   On: 09/27/2013 15:58      Assessment and Plan: JACHELLE GOSLIN is a 80 y.o. year old female presenting with R foot metatarsal fractures, morbid obesity, inability to ambulate  Fall/foot fracture: Mechanical fall in etiology. Morbid obesity is major complicating issue. Will admit pt with inpt rehab with plan for PT/OT eval with likely inpt vs. Short term rehab. Continue pain control. Place cam walker per pt/ot recs.  HTN: continue home meds. Titrate as clinically indicated.  CHF: baseline hx/o diastolic CHF. Clinically euvolemic in setting of morbid obesity. Continue current regimen. Recheck Cr in setting of diuretic use in am.  FEN/GI: heart healthy diet. PPI. Prophylaxis: subq heparin  Disposition: pending full evaluation  Code Status:DNR       Shanda Howells MD  Pager: [phone]

## 2013-09-27 NOTE — Progress Notes (Signed)
   CARE MANAGEMENT ED NOTE 09/27/2013  Patient:  LISE, PINCUS   Account Number:  [id number]  Date Initiated:  09/27/2013  Documentation initiated by:  Jackelyn Poling  Subjective/Objective Assessment:   80 yr old [place] [facility] pt with c/o fall and right hip pain. Per EMS pt was seen here recently and was diagnosed with spinal stenosis. Pt sts she felll out of bed this morning while trying to slide up. Per EMS pt lives with her     Subjective/Objective Assessment Detail:   grandson who works 2 jobs Has daughter, Lovey Newcomer at her bedside.  Pt states Dr Elmyra Ricks assisted in back surgery around 10 yrs ago Pt states she has 2 RWs, w/c, bedside commode, cane at home Pt [facility] services if needed pending further MD evaluation Pt has a family member with w/c ramp Reviewed pt scenarios of being d/c home and observation stay in hospital with her and her daughter The daughter spoke with her husband prior to CM leaving the room  pcp is [facility]     Action/Plan:   Cm noted Cm consult stating unsure of disposition Cm unable to confirm previous home health agency preference.   Action/Plan Detail:   21-4 Cm spoke with Dr Tamera Punt about ed clinical summary and response from [facility]   Anticipated DC Date:  09/27/2013     Status Recommendation to Physician:   Result of Recommendation:    Other ED Services  Consult Working Plan   In-house referral  [place]  Other  Outpatient Services - Pt will follow up   [place]   Choice offered to / List presented to:  C-1 Patient     DME agency  [facility].    Status of service:  Completed, signed off  ED Comments:   ED Comments Detail:  09/27/13 2045 CM reviewed in details medicare guidelines, home health [facility]) (length of stay in home, types of [facility] staff available,  coverage, primary caregiver, up to 24 hrs before services may be started), Private duty nursing (PDN-coverage, length of stay in the home types of staff available), assisted living (ASL- coverage, services offered) and Skilled nursing facilities (snf- coverage and services offered)  CM reviewed availability of HH SW to assist pcp to get pt to snf (if desired disposition) from the community level. CM provided family with a list of [place] home health agencies, PDN, and snfs.  Discussed if pt admitted, pt to be further evaluated by unit therapists (PT/OT) for recommendation of level of care and share this with attending MD and unit CM Also discussed that the home health agency can follow the pt if hospitalized for f/u needs  CM used the teach back method to confirm pt/daughter understanding of resources and information provided

## 2013-09-27 NOTE — Progress Notes (Signed)
Orthopedic Tech Progress Note Patient Details:  Judy Ramirez 1932-02-12 [id number] SL L&U splint applied to Right LE. Application tolerated well.  Ortho Devices Type of Ortho Device: Ace wrap;Short leg splint Ortho Device/Splint Location: Right LE Ortho Device/Splint Interventions: Application   Asia R Thompson 09/27/2013, 6:59 PM

## 2013-09-27 NOTE — ED Notes (Signed)
Bed: WA16 Expected date:  Expected time:  Means of arrival:  Comments: EMS 

## 2013-09-27 NOTE — Progress Notes (Signed)
Cm and EDP, Belfi spoke with pt and Sandy, Daughter at bedside Another daughter called to bring in pt's rw from her car Pt clarified she does not have a wheelchair at home but has been using a walker for "years"  Encouraged to use her walker while in ED to evaluate her mobility in the ED. She has a daughter who is willing to take her home and retrieve her bedside commode for her tonight. Reviewed medicare guidelines for admission and possible out of pocket expense for facility placement (informed pt/daughter generally the ED SW could share more detailed information) . CM reviewed ortho MD consult recommendations.  Received fax confirmation from clinicals [facility] to [facility] at [phone] & [phone]

## 2013-09-27 NOTE — Consult Note (Signed)
Judy Seller, MD Chief Complaint:  History: Patient history of significant spinal stenosis and lumbar spine as well as degenerative joint disease in her knees presents with increased weakness and right leg pain. She was at home with her grandson. She is morbidly obese. She states she's had ongoing problems with her right knee at times it gives out on her. She states it's been giving her more problems over the last 1-2 weeks with increased pain on ambulation and see gives out on her more when she is walking. She has some chronic numbness in her right foot which is felt to be related to the spinal stenosis. She states it's been gone on for the last several months and is unchanged from baseline. She denies any increased back pain. She denies any weakness in her arms. She denies any speech deficits or vision changes. She states she's felt weaker this morning and while she generally gets around with a walker she does have an or difficulty ambulation. She states her knee hurt worse when she tries to walk on it and this morning while she was sitting on the edge of the bed she slid off onto the floor. She was unable to get up off the floor. She denies any injuries from the fall other than she hurt her right foot. She states she really hit her head on the nightstand but denies any loss of consciousness or headache. She slid right onto her buttocks and did not feel that she caused any increased pain to her back and her knee from this fall.  Past Medical History  Diagnosis Date  . Hypertension   . Sleep apnea   . Thyroid disease   . Uterine cancer     Allergies  Allergen Reactions  . Ciprofloxacin Hcl Diarrhea and Nausea And Vomiting  . Crestor [Rosuvastatin Calcium] Other (See Comments)    "didn't feel good"  . Zocor [Simvastatin - High Dose] Other (See Comments)    "didn't feel good"  . Cephalexin Diarrhea and Nausea And Vomiting    No current facility-administered medications on file prior to  encounter.   Current Outpatient Prescriptions on File Prior to Encounter  Medication Sig Dispense Refill  . furosemide (LASIX) 40 MG tablet Take 40 mg by mouth every morning.       Marland Kitchen levothyroxine (SYNTHROID, LEVOTHROID) 137 MCG tablet Take 137 mcg by mouth daily before breakfast.       . LORazepam (ATIVAN) 0.5 MG tablet Take 0.5 mg by mouth at bedtime.       . Multiple Vitamins-Minerals (PRESERVISION AREDS PO) Take 2 tablets by mouth every morning.       . olmesartan (BENICAR) 40 MG tablet Take 40 mg by mouth every morning.       Marland Kitchen omeprazole (PRILOSEC) 20 MG capsule Take 20 mg by mouth every morning.       . potassium chloride SA (K-DUR,KLOR-CON) 20 MEQ tablet Take 40 mEq by mouth every morning.       . predniSONE (DELTASONE) 10 MG tablet Take 10 mg by mouth daily with breakfast. Started 09/22/12 10 mg three times daily for 3 days. 10 mg twice daily for 3 days. 10 mg daily for 3 days        Physical Exam: Filed Vitals:   09/27/13 1444  BP: 189/56  Pulse: 59  Temp: 98.2 F (36.8 C)  Resp: 57  Pleasant elderly woman - morbidly obese No SOB/CP Abd soft/NT Significant LBP with attempt at log-rolling Splint in place  right LE.  (By report of ER MD compartments in foot and calf were soft) Sensation to LT intact EHL intact   Image: Dg Hip Complete Right  09/27/2013   CLINICAL DATA:  Fall.  Right hip pain.  EXAM: RIGHT HIP - COMPLETE 2+ VIEW  COMPARISON:  09/25/2013.  FINDINGS: Patient's panniculus projects over the hips. L4-L5 posterior lumbar interbody fusion and left total hip arthroplasty are present. Pelvic rings are intact. The right hip shows moderate osteoarthritis with marginal osteophytes. There is no fracture.  IMPRESSION: No acute abnormality or interval change.   Electronically Signed   By: Dereck Ligas M.D.   On: 09/27/2013 15:56   Dg Hip Complete Right  09/25/2013   CLINICAL DATA:  Unable to stand or bear weight on leg for 5 days. No known injury.  EXAM: RIGHT HIP -  COMPLETE 2+ VIEW  COMPARISON:  Right knee radiographs 09/25/2013  FINDINGS: There are postoperative changes of lower lumbar spine fusion. Left hip total arthroplasty is noted. The femoral stem component is not completely included on this view. There is mild joint space narrowing and lateral spur formation of the right hip. Right hip is located. No acute fracture is identified. Visualized bowel gas pattern nonobstructive.  IMPRESSION: Mild osteoarthritis of the right hip. Postsurgical changes of the lumbar spine and left hip arthroplasty. No acute bony abnormality.   Electronically Signed   By: Curlene Dolphin M.D.   On: 09/25/2013 15:31   Dg Knee Complete 4 Views Right  09/25/2013   CLINICAL DATA:  Right leg pain. Unable to stand or apply pressure to the right knee for the past 5 days.  EXAM: RIGHT KNEE - COMPLETE 4+ VIEW  COMPARISON:  No priors.  FINDINGS: Multiple views of the right knee demonstrate no acute displaced fracture, subluxation, dislocation, or soft tissue abnormality. Severe joint space narrowing, subchondral sclerosis, subchondral cyst formation and osteophyte formation is noted throughout the knee joint, most severe in the patellofemoral compartment, compatible with advanced osteoarthritis.  IMPRESSION: 1. No acute radiographic abnormality of the right knee. 2. Severe tricompartmental osteoarthritis, most pronounced in the patellofemoral compartment.   Electronically Signed   By: Vinnie Langton M.D.   On: 09/25/2013 15:34   Dg Foot Complete Right  09/27/2013   CLINICAL DATA:  Pain and bruising of the right foot.  EXAM: RIGHT FOOT COMPLETE - 3+ VIEW  COMPARISON:  None.  FINDINGS: Second through 5th mildly displaced metatarsal neck fractures are present. Minimal intra-articular extension is present at the 5th MTP joint. Mild to moderate 1st MTP joint osteoarthritis is present. Diffuse osteopenia. Midfoot osteoarthritis is mild. Calcaneal spurs are present.  IMPRESSION: Mildly displaced 2nd through  5th metatarsal neck fractures.   Electronically Signed   By: Dereck Ligas M.D.   On: 09/27/2013 15:58    A/P:  Patient with non-displaced MT neck fractures of the right foot.  No history of acute trauma.  Has been in the ER recently because of decreased mobility and chronic pain.  Patient with increased foot pain recently and xrays done during this ER visit.   No need for surgical intervention.  No evidence of compartment syndrome.  Closed injury Recommendations:  Convert posterior splint to CAM walker     F/U 2-3 weeks with Dr Delilah Shan for ongoing non-operative management of foot fractures    WBAT in the CAM boot - walker may be required    [facility] meds per ER staff.

## 2013-09-27 NOTE — ED Provider Notes (Addendum)
CSN: [id number]     Arrival date & time 09/27/13  1422 History   First MD Initiated Contact with Patient 09/27/13 [id number]     Chief Complaint  Patient presents with  . Fall  . Hip Pain   (Consider location/radiation/quality/duration/timing/severity/associated sxs/prior Treatment) HPI Comments: Patient history of significant spinal stenosis and lumbar spine as well as degenerative joint disease in her knees presents with increased weakness and right leg pain. She was at home with her grandson. She is morbidly obese. She states she's had ongoing problems with her right knee at times it gives out on her. She states it's been giving her more problems over the last 1-2 weeks with increased pain on ambulation and see gives out on her more when she is walking. She has some chronic numbness in her right foot which is felt to be related to the spinal stenosis. She states it's been gone on for the last several months and is unchanged from baseline. She denies any increased back pain. She denies any weakness in her arms. She denies any speech deficits or vision changes. She states she's felt weaker this morning and while she generally gets around with a walker she does have an or difficulty ambulation. She states her knee hurt worse when she tries to walk on it and this morning while she was sitting on the edge of the bed she slid off onto the floor. She was unable to get up off the floor. She denies any injuries from the fall other than she hurt her right foot. She states she really hit her head on the nightstand but denies any loss of consciousness or headache. She slid right onto her buttocks and did not feel that she caused any increased pain to her back and her knee from this fall.  Patient is a 80 y.o. female presenting with fall and hip pain.  Fall Pertinent negatives include no chest pain, no abdominal pain, no headaches and no shortness of breath.  Hip Pain Pertinent negatives include no chest pain, no  abdominal pain, no headaches and no shortness of breath.    Past Medical History  Diagnosis Date  . Hypertension   . Sleep apnea   . Thyroid disease   . Uterine cancer    Past Surgical History  Procedure Laterality Date  . Abdominal hysterectomy    . Back surgery    . Total hip arthroplasty    . Knee arthroscopy Bilateral    History reviewed. No pertinent family history. History  Substance Use Topics  . Smoking status: Never Smoker   . Smokeless tobacco: Never Used  . Alcohol Use: No   OB History   Grav Para Term Preterm Abortions TAB SAB Ect Mult Living                 Review of Systems  Constitutional: Negative for fever, chills, diaphoresis and fatigue.  HENT: Negative for congestion, rhinorrhea and sneezing.   Eyes: Negative.   Respiratory: Negative for cough, chest tightness and shortness of breath.   Cardiovascular: Negative for chest pain and leg swelling.  Gastrointestinal: Negative for nausea, vomiting, abdominal pain, diarrhea and blood in stool.  Genitourinary: Negative for frequency, hematuria, flank pain and difficulty urinating.  Musculoskeletal: Positive for arthralgias and back pain (only on ambulation).  Skin: Negative for rash.  Neurological: Positive for weakness (generalized, increased weakness to right leg) and numbness (right leg). Negative for dizziness, speech difficulty and headaches.    Allergies  Ciprofloxacin hcl;  Crestor; Zocor; and Cephalexin  Home Medications   Current Outpatient Rx  Name  Route  Sig  Dispense  Refill  . furosemide (LASIX) 40 MG tablet   Oral   Take 40 mg by mouth every morning.          . gabapentin (NEURONTIN) 300 MG capsule   Oral   Take 900 mg by mouth every morning.         Marland Kitchen HYDROcodone-acetaminophen (NORCO/VICODIN) 5-325 MG per tablet   Oral   Take 1-2 tablets by mouth every 4 (four) hours as needed for moderate pain or severe pain.         Marland Kitchen levothyroxine (SYNTHROID, LEVOTHROID) 137 MCG tablet    Oral   Take 137 mcg by mouth daily before breakfast.          . LORazepam (ATIVAN) 0.5 MG tablet   Oral   Take 0.5 mg by mouth at bedtime.          . Multiple Vitamins-Minerals (PRESERVISION AREDS PO)   Oral   Take 2 tablets by mouth every morning.          . olmesartan (BENICAR) 40 MG tablet   Oral   Take 40 mg by mouth every morning.          Marland Kitchen omeprazole (PRILOSEC) 20 MG capsule   Oral   Take 20 mg by mouth every morning.          . potassium chloride SA (K-DUR,KLOR-CON) 20 MEQ tablet   Oral   Take 40 mEq by mouth every morning.          . predniSONE (DELTASONE) 10 MG tablet   Oral   Take 10 mg by mouth daily with breakfast. Started 09/22/12 10 mg three times daily for 3 days. 10 mg twice daily for 3 days. 10 mg daily for 3 days          BP 189/56  Pulse 59  Temp(Src) 98.2 F (36.8 C) (Oral)  Resp 16  SpO2 97% Physical Exam  Constitutional: She is oriented to person, place, and time. She appears well-developed and well-nourished.  HENT:  Head: Normocephalic and atraumatic.  Eyes: Pupils are equal, round, and reactive to light.  Neck: Normal range of motion. Neck supple.  Cardiovascular: Normal rate, regular rhythm and normal heart sounds.   Pulmonary/Chest: Effort normal and breath sounds normal. No respiratory distress. She has no wheezes. She has no rales. She exhibits no tenderness.  Abdominal: Soft. Bowel sounds are normal. There is no tenderness. There is no rebound and no guarding.  Musculoskeletal: Normal range of motion. She exhibits no edema.  Diffuse TTP right knee, mild swelling.  Mild swelling to right foot with ecchymosis, TTP to dorsum of foot.  Mild pain on ROM of right hip.  Dorsalis pedis pulse intact bilaterally, but stronger on left.  PT pulses symmetric.  No significant TTP along spine  Lymphadenopathy:    She has no cervical adenopathy.  Neurological: She is alert and oriented to person, place, and time.  Skin: Skin is warm and  dry. No rash noted.  Psychiatric: She has a normal mood and affect.    ED Course  Procedures (including critical care time) Labs Review Labs Reviewed  POCT I-STAT, CHEM 8 - Abnormal; Notable for the following:    BUN 39 (*)    Creatinine, Ser 1.80 (*)    Glucose, Bld 119 (*)    All other components within normal limits  URINALYSIS,  ROUTINE W REFLEX MICROSCOPIC   Imaging Review Dg Hip Complete Right  09/27/2013   CLINICAL DATA:  Fall.  Right hip pain.  EXAM: RIGHT HIP - COMPLETE 2+ VIEW  COMPARISON:  09/25/2013.  FINDINGS: Patient's panniculus projects over the hips. L4-L5 posterior lumbar interbody fusion and left total hip arthroplasty are present. Pelvic rings are intact. The right hip shows moderate osteoarthritis with marginal osteophytes. There is no fracture.  IMPRESSION: No acute abnormality or interval change.   Electronically Signed   By: Dereck Ligas M.D.   On: 09/27/2013 15:56   Dg Foot Complete Right  09/27/2013   CLINICAL DATA:  Pain and bruising of the right foot.  EXAM: RIGHT FOOT COMPLETE - 3+ VIEW  COMPARISON:  None.  FINDINGS: Second through 5th mildly displaced metatarsal neck fractures are present. Minimal intra-articular extension is present at the 5th MTP joint. Mild to moderate 1st MTP joint osteoarthritis is present. Diffuse osteopenia. Midfoot osteoarthritis is mild. Calcaneal spurs are present.  IMPRESSION: Mildly displaced 2nd through 5th metatarsal neck fractures.   Electronically Signed   By: Dereck Ligas M.D.   On: 09/27/2013 15:58    EKG Interpretation   None       MDM   1. Multiple closed fractures of metatarsal bone, right, initial encounter    Patient presents with chronic mobility issues due to her size and her ongoing back pain with neuropathy. She states her leg gives out on her frequently but it's been giving out on her more over the last few weeks. Due to her fall today, she has poor metatarsal fractures. She states she essentially lives at  home by herself with her son coming home only at night. She's concerned about her ability to get around at home. She was previously ambulate with a walker but today he has felt like she can't even get around with a walker. And given her fractures I don't feel that she's can have the ability to ambulate at home. I spoke with Dr. Rolena Infante with orthopedics who will see the patient's but he requested if the patient needs to be admitted to admit to medicine. I will consult the hospitalist regarding this.  I did consult case management to see if we could possibly set up home health services in the patient be discharged. However we did attempt to get the patient out and not eating she can do a sit on the edge of the bed. She can't put weight on either legs. Given this I don't feel that she would be safe being discharged.  Malvin Johns, MD 09/27/13 Leane Platt, MD 09/27/13 2258

## 2013-09-27 NOTE — ED Notes (Signed)
Ortho called for short leg splint placement.

## 2013-09-27 NOTE — ED Notes (Signed)
Per EMS pt coming from with c/o fall and right hip pain. Per EMS pt was seen here recently and was diagnosed with spinal stenosis. Pt sts she felll out of bed this morning while trying to slide up. Per EMS pt lives with her grandson and wants help with nursing facility placement due to being unable to ambulate since Wednesday.

## 2013-09-27 NOTE — Progress Notes (Signed)
Choice offered to / List presented to: HOME HEALTH AGENCIES SERVING [place]   Agencies that are Medicare-Certified and are affiliated with [facility]  Telephone Number Address  [facility]   [facility] has ownership interest in this company; however, you are under no obligation to use this agency. [phone] or  [phone] [address] [place], [place] [address] [email or web address]   Agencies that are Medicare-Certified and are not affiliated with The  [facility]                                                                                 Home Health Agency Telephone Number Address  [facility] [phone] Fax [phone] [address], [address] [place], [place]  [address] [email or web address]  [facility] [phone] or [phone] Fax [phone] [address] [place], [place] [address] [email or web address]  [facility] [phone] Fax [phone] [address] [place], [place] [address] [email or web address]  [facility] [phone] Fax [phone] [address], [address] [place], [place]  [address] [email or web address]  [facility] [phone] Fax [phone] [address] [place], [place] [address] [email or web address]  [facility] [phone] [address] [place], [place] [address] [email or web address]  [facility] [phone]  [address] [place], [place] [address] [email or web address]  [facility] [phone] or [phone] Fax number [phone] [address] [place], [place]  [address] [email or web address]  [facility] [phone] Fax [phone] [address] [place], [place]  [address]  [facility]  [phone] Fax [phone] [address] [place],  [address] [email or web address]   

## 2013-09-28 DIAGNOSIS — I10 Essential (primary) hypertension: Secondary | ICD-10-CM

## 2013-09-28 DIAGNOSIS — S92309A Fracture of unspecified metatarsal bone(s), unspecified foot, initial encounter for closed fracture: Secondary | ICD-10-CM

## 2013-09-28 DIAGNOSIS — N189 Chronic kidney disease, unspecified: Secondary | ICD-10-CM | POA: Diagnosis present

## 2013-09-28 DIAGNOSIS — G4733 Obstructive sleep apnea (adult) (pediatric): Secondary | ICD-10-CM

## 2013-09-28 DIAGNOSIS — S8290XS Unspecified fracture of unspecified lower leg, sequela: Secondary | ICD-10-CM

## 2013-09-28 DIAGNOSIS — R0981 Nasal congestion: Secondary | ICD-10-CM | POA: Diagnosis present

## 2013-09-28 LAB — COMPREHENSIVE METABOLIC PANEL
ALBUMIN: 3.3 g/dL — AB (ref 3.5–5.2)
ALT: 11 U/L (ref 0–35)
AST: 14 U/L (ref 0–37)
Alkaline Phosphatase: 88 U/L (ref 39–117)
BUN: 34 mg/dL — ABNORMAL HIGH (ref 6–23)
CALCIUM: 8.9 mg/dL (ref 8.4–10.5)
CO2: 23 mEq/L (ref 19–32)
CREATININE: 1.47 mg/dL — AB (ref 0.50–1.10)
Chloride: 101 mEq/L (ref 96–112)
GFR calc Af Amer: 38 mL/min — ABNORMAL LOW (ref >90)
GFR, EST NON AFRICAN AMERICAN: 33 mL/min — AB (ref >90)
Glucose, Bld: 136 mg/dL — ABNORMAL HIGH (ref 70–99)
Potassium: 4.7 mEq/L (ref 3.7–5.3)
SODIUM: 137 meq/L (ref 137–147)
TOTAL PROTEIN: 6.7 g/dL (ref 6.0–8.3)
Total Bilirubin: 0.4 mg/dL (ref 0.3–1.2)

## 2013-09-28 MED ORDER — MUPIROCIN CALCIUM 2 % EX CREA
TOPICAL_CREAM | Freq: Every day | CUTANEOUS | Status: DC
  Administered 2013-09-28 – 2013-09-29 (×2): via TOPICAL
  Filled 2013-09-28: qty 15

## 2013-09-28 MED ORDER — IPRATROPIUM-ALBUTEROL 0.5-2.5 (3) MG/3ML IN SOLN
3.0000 mL | Freq: Four times a day (QID) | RESPIRATORY_TRACT | Status: DC
  Administered 2013-09-28 – 2013-09-29 (×3): 3 mL via RESPIRATORY_TRACT
  Filled 2013-09-28 (×3): qty 3

## 2013-09-28 MED ORDER — DM-GUAIFENESIN ER 30-600 MG PO TB12
1.0000 | ORAL_TABLET | Freq: Two times a day (BID) | ORAL | Status: DC
  Administered 2013-09-28 – 2013-09-29 (×2): 1 via ORAL
  Filled 2013-09-28 (×4): qty 1

## 2013-09-28 MED ORDER — DM-GUAIFENESIN ER 30-600 MG PO TB12: 1.0000 | ORAL_TABLET | Freq: Two times a day (BID) | ORAL | Status: AC

## 2013-09-28 NOTE — Progress Notes (Signed)
[facility]  Patient Status: New - referral faxed in to [facility] from the ED on 09/27/13  [facility] is providing the following services: RN, PT, OT, MSW and HHA  If patient discharges after hours, please call [phone].   Lurlean Leyden 09/28/2013, 9:42 AM

## 2013-09-28 NOTE — Progress Notes (Signed)
UR completed 

## 2013-09-28 NOTE — Progress Notes (Signed)
Clinical Social Work Department CLINICAL SOCIAL WORK PLACEMENT NOTE 09/28/2013  Patient:  Judy Ramirez, Judy Ramirez  Account Number:  [id number] Admit date:  09/27/2013  Clinical Social Worker:  Werner Lean, LCSW  Date/time:  09/28/2013 02:23 PM  Clinical Social Work is seeking post-discharge placement for this patient at the following level of care:   SKILLED NURSING   (*CSW will update this form in Epic as items are completed)   09/28/2013  Patient/family provided with [place] Department of Clinical Social Work's list of facilities offering this level of care within the geographic area requested by the patient (or if unable, by the patient's family).  09/28/2013  Patient/family informed of their freedom to choose among providers that offer the needed level of care, that participate in Medicare, Medicaid or managed care program needed by the patient, have an available bed and are willing to accept the patient.    Patient/family informed of [facility]' ownership interest in [facility], as well as of the fact that they are under no obligation to receive care at this facility.  PASARR submitted to EDS on 09/28/2013 PASARR number received from EDS on 09/28/2013  FL2 transmitted to all facilities in geographic area requested by pt/family on  09/28/2013 FL2 transmitted to all facilities within larger geographic area on   Patient informed that his/her managed care company has contracts with or will negotiate with  certain facilities, including the following:     Patient/family informed of bed offers received:   Patient chooses bed at  Physician recommends and patient chooses bed at    Patient to be transferred to  on   Patient to be transferred to facility by   The following physician request were entered in Epic:   Additional Comments:  Werner Lean LCSW [phone]

## 2013-09-28 NOTE — Discharge Summary (Addendum)
Physician Discharge Summary  Judy Ramirez [id number] DOB: 12-Jun-1932 DOA: 09/27/2013  PCP: Woody Seller, MD  Admit date: 09/27/2013 Discharge date: 09/29/2013  Time spent: 40 minute   Recommendations for Outpatient Follow-up:  Right foot fracture -Per orthopedic surgery Dr. Melina Schools Recommendations: Convert posterior splint to CAM walker  F/U 2-3 weeks with Dr Delilah Shan for ongoing non-operative management of foot fractures  WBAT in the CAM boot - walker may be required  Prednisone taper as directed on prescription  HTN -Not within AHA guidelines but considering patient's age and propensity for falling would not try to lower further  Pulmonary hypertension -Will continue home medication  Chronic renal failure -Stable awake nephrotoxic medication -Followup with PCP to monitor at least quarterly  Nasal/sinus congestion/bronchitis -wheezing heard on exam, sats appropriate on minimal O2 support - reports sx recently began shortly after exposure to cleaning solvents -Started DuoNeb -Started Mucinex DM - d/c on PRN albuterol MDI with steroid taper  Discharge Diagnoses:  Active Problems:   HYPERTENSION, UNSPECIFIED   PULMONARY HYPERTENSION   Chronic diastolic heart failure   Foot fracture, right   Chronic renal failure   Nasal congestion   Discharge Condition: Stable  Diet recommendation: Heart healthy  Filed Weights   09/28/13 0537 09/28/13 1454 09/29/13 0638  Weight: 149.5 kg (329 lb 9.4 oz) 145.877 kg (321 lb 9.6 oz) 144.2 kg (317 lb 14.5 oz)    History of present illness:  80 y.o. WF PMHx Morbid obesity, HTN, chronic diastolic CHF, pulmonary hypertension, OSA. Presented ED with R foot metatarsal fractures s/p fall. Pt states that she was trying to get out of bed this am when she accidentally fell, striking R foot against the wall. Pt states that she has had persistent pain in R foot since this point. Pt uses rolling walker to ambulate at baseline. Has been  unable to ambulate since R foot injury. Pt noted to have been seen in ER 2 days ago for R hip pain. R hip xrays negative at the time for fracture or dislocation. Did show arthritis. Was placed on neurontin.  Pt presented to the ER today 2/2 to foot pain s/p fall. Had R foot xray the showed mildly displaced 2nd through 5th metatarsal neck fractures. Pt was initially evaluated with plan for d/c home and outpt PT/OT follow up. However, pt was unable to ambulate. Recommendation is now for inpt admission with likely inpt rehab pending PT/OT eval. Ortho was consulted with recs for nonoperative management. 09/28/2013 currently patient states pain is tolerable as long as she does in her foot. Main concern is sinus congestion and tightness in her chest.     Consultations: Dr. Melina Schools (orthopedic surgery)   Discharge Exam: Filed Vitals:   09/28/13 2203 09/28/13 2207 09/29/13 0638 09/29/13 0919  BP:   148/89   Pulse:  79 69   Temp:   98.8 F (37.1 C)   TempSrc:   Oral   Resp:  20 20   Height:      Weight:   144.2 kg (317 lb 14.5 oz)   SpO2: 93% 95% 91% 96%    General: A./O. x4, NAD, nasal and sinus congestion Cardiovascular: Rhythm and rate, negative murmurs rubs gallops, Respiratory: Mild wheezing B, good air movement, normal resp effort  Discharge Instructions      Discharge Orders   Future Orders Complete By Expires   Face-to-face encounter (required for Medicare/Medicaid patients)  As directed    Comments:     I BELFI,  MELANIE certify that this patient is under my care and that I, or a nurse practitioner or physician's assistant working with me, had a face-to-face encounter that meets the physician face-to-face encounter requirements with this patient on 09/27/2013. The encounter with the patient was in whole, or in part for the following medical condition(s) which is the primary reason for home health care (List medical condition): All further orders must come from the primary care  doctor, Dr Kathryne Eriksson   Questions:     The encounter with the patient was in whole, or in part, for the following medical condition, which is the primary reason for home health care:  fall, back/leg pain, has foot fractures   I certify that, based on my findings, the following services are medically necessary home health services:  Nursing   Physical therapy   My clinical findings support the need for the above services:  Pain interferes with ambulation/mobility   Unsafe ambulation due to balance issues   Further, I certify that my clinical findings support that this patient is homebound due to:  Unsafe ambulation due to balance issues   Pain interferes with ambulation/mobility   Reason for Medically Necessary Home Health Services:  Skilled Nursing- Skilled Assessment/Observation   Therapy- Personnel officer, Public librarian       Medication List    STOP taking these medications       LORazepam 0.5 MG tablet  Commonly known as:  ATIVAN      TAKE these medications       albuterol 108 (90 BASE) MCG/ACT inhaler  Commonly known as:  PROVENTIL HFA;VENTOLIN HFA  Inhale 2 puffs into the lungs every 4 (four) hours as needed for wheezing or shortness of breath.     dextromethorphan-guaiFENesin 30-600 MG per 12 hr tablet  Commonly known as:  MUCINEX DM  Take 1 tablet by mouth 2 (two) times daily.     furosemide 40 MG tablet  Commonly known as:  LASIX  Take 40 mg by mouth every morning.     gabapentin 300 MG capsule  Commonly known as:  NEURONTIN  Take 900 mg by mouth every morning.     HYDROcodone-acetaminophen 5-325 MG per tablet  Commonly known as:  NORCO/VICODIN  Take 1-2 tablets by mouth every 4 (four) hours as needed for moderate pain or severe pain.     levothyroxine 137 MCG tablet  Commonly known as:  SYNTHROID, LEVOTHROID  Take 137 mcg by mouth daily before breakfast.     olmesartan 40 MG tablet  Commonly known as:  BENICAR  Take 40 mg by mouth every  morning.     omeprazole 20 MG capsule  Commonly known as:  PRILOSEC  Take 20 mg by mouth every morning.     potassium chloride SA 20 MEQ tablet  Commonly known as:  K-DUR,KLOR-CON  Take 40 mEq by mouth every morning.     predniSONE 5 MG tablet  Commonly known as:  DELTASONE  Take 1 tablet (5 mg total) by mouth daily with breakfast.     PRESERVISION AREDS PO  Take 2 tablets by mouth every morning.       Allergies  Allergen Reactions  . Ciprofloxacin Hcl Diarrhea and Nausea And Vomiting  . Crestor [Rosuvastatin Calcium] Other (See Comments)    "didn't feel good"  . Zocor [Simvastatin - High Dose] Other (See Comments)    "didn't feel good"  . Cephalexin Diarrhea and Nausea And Vomiting   Follow-up Information  Follow up with KENDALL, ADAM, MD. Call in 2 weeks.   Specialty:  Family Medicine   Contact information:   [address] [place] [address] [phone]       Follow up with Woody Seller, MD. Schedule an appointment as soon as possible for a visit in 2 weeks.   Specialty:  Family Medicine   Contact information:   [address] [place] [address] [phone]        The results of significant diagnostics from this hospitalization (including imaging, microbiology, ancillary and laboratory) are listed below for reference.    Significant Diagnostic Studies: Dg Hip Complete Right  09/27/2013   CLINICAL DATA:  Fall.  Right hip pain.  EXAM: RIGHT HIP - COMPLETE 2+ VIEW  COMPARISON:  09/25/2013.  FINDINGS: Patient's panniculus projects over the hips. L4-L5 posterior lumbar interbody fusion and left total hip arthroplasty are present. Pelvic rings are intact. The right hip shows moderate osteoarthritis with marginal osteophytes. There is no fracture.  IMPRESSION: No acute abnormality or interval change.   Electronically Signed   By: Dereck Ligas M.D.   On: 09/27/2013 15:56   Dg Hip Complete Right  09/25/2013   CLINICAL DATA:  Unable to  stand or bear weight on leg for 5 days. No known injury.  EXAM: RIGHT HIP - COMPLETE 2+ VIEW  COMPARISON:  Right knee radiographs 09/25/2013  FINDINGS: There are postoperative changes of lower lumbar spine fusion. Left hip total arthroplasty is noted. The femoral stem component is not completely included on this view. There is mild joint space narrowing and lateral spur formation of the right hip. Right hip is located. No acute fracture is identified. Visualized bowel gas pattern nonobstructive.  IMPRESSION: Mild osteoarthritis of the right hip. Postsurgical changes of the lumbar spine and left hip arthroplasty. No acute bony abnormality.   Electronically Signed   By: Curlene Dolphin M.D.   On: 09/25/2013 15:31   Dg Knee Complete 4 Views Right  09/25/2013   CLINICAL DATA:  Right leg pain. Unable to stand or apply pressure to the right knee for the past 5 days.  EXAM: RIGHT KNEE - COMPLETE 4+ VIEW  COMPARISON:  No priors.  FINDINGS: Multiple views of the right knee demonstrate no acute displaced fracture, subluxation, dislocation, or soft tissue abnormality. Severe joint space narrowing, subchondral sclerosis, subchondral cyst formation and osteophyte formation is noted throughout the knee joint, most severe in the patellofemoral compartment, compatible with advanced osteoarthritis.  IMPRESSION: 1. No acute radiographic abnormality of the right knee. 2. Severe tricompartmental osteoarthritis, most pronounced in the patellofemoral compartment.   Electronically Signed   By: Vinnie Langton M.D.   On: 09/25/2013 15:34   Dg Foot Complete Right  09/27/2013   CLINICAL DATA:  Pain and bruising of the right foot.  EXAM: RIGHT FOOT COMPLETE - 3+ VIEW  COMPARISON:  None.  FINDINGS: Second through 5th mildly displaced metatarsal neck fractures are present. Minimal intra-articular extension is present at the 5th MTP joint. Mild to moderate 1st MTP joint osteoarthritis is present. Diffuse osteopenia. Midfoot osteoarthritis is  mild. Calcaneal spurs are present.  IMPRESSION: Mildly displaced 2nd through 5th metatarsal neck fractures.   Electronically Signed   By: Dereck Ligas M.D.   On: 09/27/2013 15:58    Microbiology: No results found for this or any previous visit (from the past 240 hour(s)).   Labs: Basic Metabolic Panel:  Recent Labs Lab 09/27/13 1607 09/28/13 0120 09/29/13 7062  NA 142 137 138  K 4.5 4.7 4.4  CL 108 101 100  CO2  --  23 24  GLUCOSE 119* 136* 111*  BUN 39* 34* 32*  CREATININE 1.80* 1.47* 1.71*  CALCIUM  --  8.9 8.8   Liver Function Tests:  Recent Labs Lab 09/28/13 0120 09/29/13 0514  AST 14 22  ALT 11 28  ALKPHOS 88 91  BILITOT 0.4 0.4  PROT 6.7 6.4  ALBUMIN 3.3* 3.2*   No results found for this basename: LIPASE, AMYLASE,  in the last 168 hours No results found for this basename: AMMONIA,  in the last 168 hours CBC:  Recent Labs Lab 09/27/13 1607 09/29/13 0514  WBC  --  12.2*  HGB 14.3 11.6*  HCT 42.0 38.2  MCV  --  77.8*  PLT  --  213   Cardiac Enzymes: No results found for this basename: CKTOTAL, CKMB, CKMBINDEX, TROPONINI,  in the last 168 hours BNP: BNP (last 3 results) No results found for this basename: PROBNP,  in the last 8760 hours CBG: No results found for this basename: GLUCAP,  in the last 168 hours   Signed:  Dia Crawford, MD [facility] [phone] pager

## 2013-09-28 NOTE — Evaluation (Signed)
Occupational Therapy Evaluation Patient Details Name: Judy Ramirez MRN: [id number] DOB: Jan 10, 1932 Today's Date: 09/28/2013 Time: 5397-6734 OT Time Calculation (min): 22 min  OT Assessment / Plan / Recommendation History of present illness fall, metatarsal fracture, morbid obesity, inability to ambulate  , Cam boot, WBAT R foot   Clinical Impression   Pt was admitted with the above.  She was managing at home at a mod I level but struggled.  She needs A x 2 currently for safety.  She will benefit from skilled OT to increase safety and independence with adls with overall min A level goals in acute.    OT Assessment  Patient needs continued OT Services    Follow Up Recommendations  SNF    Barriers to Discharge      Equipment Recommendations    3:1 commode, if she doesn't have   Recommendations for Other Services    Frequency  Min 2X/week    Precautions / Restrictions Precautions Precautions: Fall Required Braces or Orthoses: Other Brace/Splint Other Brace/Splint: camboot Restrictions Weight Bearing Restrictions: No Other Position/Activity Restrictions: wbat with cam boot   Pertinent Vitals/Pain No c/o pain    ADL  Grooming: Brushing hair;Set up Where Assessed - Grooming: Supported sitting Upper Body Bathing: Set up Where Assessed - Upper Body Bathing: Supported sitting Lower Body Bathing: +2 Total assistance Lower Body Bathing: Patient Percentage: 50% Where Assessed - Lower Body Bathing: Supported sit to stand Upper Body Dressing: Set up Where Assessed - Upper Body Dressing: Supported sitting Lower Body Dressing: +2 Total assistance (with sock aide) Lower Body Dressing: Patient Percentage: 50% Where Assessed - Lower Body Dressing: Supported sit to Lobbyist: Haematologist: Patient Percentage: 50% Armed forces technical officer Method: Stand pivot (bed to recliner) Equipment Used: Rolling walker Transfers/Ambulation Related to ADLs:  spt to chair:  pt unweighted heel and swivelled to chair:  unable to step ADL Comments: pt needs A x 2 for adls for sit to stand.  She uses a sock aid at home but has been able to hook pants over feet prior to admission    OT Diagnosis: Generalized weakness  OT Problem List: Decreased strength;Decreased activity tolerance;Impaired balance (sitting and/or standing);Decreased knowledge of use of DME or AE;Pain OT Treatment Interventions: Self-care/ADL training;DME and/or AE instruction;Balance training;Patient/family education;Therapeutic activities   OT Goals(Current goals can be found in the care plan section) Acute Rehab OT Goals Patient Stated Goal: To get up OT Goal Formulation: With patient Time For Goal Achievement: 09/28/2013 Potential to Achieve Goals: Good ADL Goals Pt Will Perform Lower Body Bathing: with min assist;sit to/from stand;with adaptive equipment Pt Will Perform Lower Body Dressing: with min assist;with adaptive equipment;sit to/from stand Pt Will Transfer to Toilet: with min assist;bedside commode;stand pivot transfer Pt Will Perform Toileting - Clothing Manipulation and hygiene: with min assist;sit to/from stand  Visit Information  Last OT Received On: 09/28/13 Assistance Needed: +2 PT/OT/SLP Co-Evaluation/Treatment: Yes Reason for Co-Treatment: For patient/therapist safety PT goals addressed during session: Mobility/safety with mobility;Proper use of DME;Balance OT goals addressed during session: ADL's and self-care (mobility related to:) History of Present Illness: fall, metatarsal fracture, morbid obesity, inability to ambulate  , Cam boot, WBAT R foot       Prior Functioning     Home Living Family/patient expects to be discharged to:: Skilled nursing facility Living Arrangements: Other relatives Additional Comments: family not available 24/7 Prior Function Level of Independence: Independent with assistive device(s) Communication Communication: No  difficulties  Vision/Perception     Cognition  Cognition Arousal/Alertness: Awake/alert Behavior During Therapy: WFL for tasks assessed/performed Overall Cognitive Status: Within Functional Limits for tasks assessed    Extremity/Trunk Assessment Upper Extremity Assessment Upper Extremity Assessment: Generalized weakness (R shoulder painful:  arthritis present) Lower Extremity Assessment per PT: Lower Extremity Assessment: RLE deficits/detail;LLE deficits/detail;Generalized weakness RLE Deficits / Details: hip flexion 3= RLE Sensation: history of peripheral neuropathy LLE Sensation: history of peripheral neuropathy     Mobility Bed Mobility Bed Mobility: Supine to Sit;Sitting - Scoot to Edge of Bed Supine to Sit: HOB elevated;1: +2 Total assist;With rails Supine to Sit: Patient Percentage: 60% Sitting - Scoot to Edge of Bed: 3: Mod assist Details for Bed Mobility Assistance: asssit to raise trunk, support RLE. Transfers Sit to Stand: From bed;1: +2 Total assist Sit to Stand: Patient Percentage: 50% Stand to Sit: To chair/3-in-1;1: +2 Total assist Stand to Sit: Patient Percentage: 50% Details for Transfer Assistance: decreased ability to take a step/weight bear on R enough. Pt scooted to turn to get infront of recliner and had to sit down quickly.     Exercise     Balance Balance Balance Assessed: Yes  Decreased standing balance: A x 2 for safety   End of Session OT - End of Session Activity Tolerance: Patient tolerated treatment well Patient left: in chair;with call bell/phone within reach  [place] 09/28/2013, 4:10 PM Lesle Chris, OTR/L [phone] 09/28/2013

## 2013-09-28 NOTE — Consult Note (Addendum)
WOC wound consult note Reason for Consult:Consult requested for left great toe.  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  She no longer has to be seen according to the daughter at the bedside.  Pt was previously assessed by ortho service, but only for right foot injuries.  Dry brown scab removes easily, revealing full thickness wound; .2X.2X.2cm; 100% red and dry, no odor or drainage.   Entire left great toe with generalized erythremia and edema. Very tender to touch.  Pt states she bumped it on an object prior to admission.  Small partial thickness wound to tip of toe; .1X.1X.1cm, pink and moist.  No odor or drainage. Dressing procedure/placement/frequency: Consider X-ray to R/O possible fracture if this is a concern. Plan: Bactroban to provide antimicrobial benefits and promote moist healing to both sites. Please re-consult if further assistance is needed.  Thank-you,  Julien Girt MSN, [place], [place], [place], [place]

## 2013-09-28 NOTE — Progress Notes (Signed)
CSW assisting with D/C planning. Pt / family have accepted SNF bed at [facility]. SNF can admit once pt is medically stable and [employer] has provided authorization. CSW has been in contact with insurance. Clinicals are being reviewed. CSW will provide update in the am.  Werner Lean LCSW [phone]

## 2013-09-28 NOTE — Progress Notes (Signed)
Clinical Social Work Department BRIEF PSYCHOSOCIAL ASSESSMENT 09/28/2013  Patient:  Judy Ramirez, Judy Ramirez     Account Number:  [id number]     Admit date:  09/27/2013  Clinical Social Worker:  Lacie Scotts  Date/Time:  09/28/2013 02:15 PM  Referred by:  Physician  Date Referred:  09/28/2013 Referred for  SNF Placement   Other Referral:   Interview type:  Patient Other interview type:    PSYCHOSOCIAL DATA Living Status:  ALONE Admitted from facility:   Level of care:   Primary support name:  Gwen Pounds Primary support relationship to patient:  CHILD, ADULT Degree of support available:   supportive    CURRENT CONCERNS Current Concerns  Post-Acute Placement   Other Concerns:    SOCIAL WORK ASSESSMENT / PLAN Pt is a 80 yr old female living at home prior to hospitalization. Pt admitted with a foot fx. Surgery is not required. ST rehab will be needed following hospital d/c. CSW met with pt / daughter to assist with d/c planning. Pt / family are in agreement with plan for rehab. SNF search has been initiated and bed offers are pending. Pt has [employer] which requires prior authorization. CSW will assist with process.   Assessment/plan status:  Psychosocial Support/Ongoing Assessment of Needs Other assessment/ plan:   Information/referral to community resources:   Insurance coverage for SNF reviewed.    PATIENT'S/FAMILY'S RESPONSE TO PLAN OF CARE: Pt / family would like a rehab close to home.    Werner Lean LCSW [phone]

## 2013-09-28 NOTE — Evaluation (Signed)
Physical Therapy Evaluation Patient Details Name: Judy Ramirez MRN: [id number] DOB: 20-Oct-1931 Today's Date: 09/28/2013 Time: 5852-7782 PT Time Calculation (min): 22 min  PT Assessment / Plan / Recommendation History of Present Illness  fall, metatarsal fracture, morbid obesity, inability to ambulate  , Cam boot, WBAT R foot  Clinical Impression  Pt Requires extensive assistance for bed mobility and transfers. Pt will benefit from PT to address problems listed below. Recommend SNF.   PT Assessment  Patient needs continued PT services    Follow Up Recommendations  SNF    Does the patient have the potential to tolerate intense rehabilitation      Barriers to Discharge Decreased caregiver support      Equipment Recommendations  None recommended by PT    Recommendations for Other Services     Frequency Min 3X/week    Precautions / Restrictions Precautions Precautions: Fall Required Braces or Orthoses: Other Brace/Splint Other Brace/Splint: camboot   Pertinent Vitals/Pain sats >94%- audible wheezes.      Mobility  Bed Mobility Bed Mobility: Supine to Sit;Sitting - Scoot to Edge of Bed Supine to Sit: HOB elevated;1: +2 Total assist;With rails Supine to Sit: Patient Percentage: 60% Sitting - Scoot to Edge of Bed: 3: Mod assist Details for Bed Mobility Assistance: asssit to raise trunk, support RLE. Transfers Transfers: Sit to Stand;Stand to Sit;Stand Pivot Transfers Sit to Stand: From bed;1: +2 Total assist Sit to Stand: Patient Percentage: 50% Stand to Sit: To chair/3-in-1;1: +2 Total assist Stand to Sit: Patient Percentage: 50% Stand Pivot Transfers: 1: +2 Total assist Stand Pivot Transfers: Patient Percentage: 50% Details for Transfer Assistance: decreased ability to take a step/weight bear on R enough. Pt scooted to turn to get infront of recliner and had to sit down quickly. Ambulation/Gait Ambulation/Gait Assistance: Not tested (comment)    Exercises      PT Diagnosis: Difficulty walking;Generalized weakness;Acute pain  PT Problem List: Decreased strength;Decreased activity tolerance;Decreased mobility;Pain;Decreased knowledge of precautions;Decreased safety awareness;Decreased range of motion;Obesity;Decreased knowledge of use of DME PT Treatment Interventions: DME instruction;Functional mobility training;Therapeutic activities;Therapeutic exercise;Patient/family education     PT Goals(Current goals can be found in the care plan section) Acute Rehab PT Goals Patient Stated Goal: To get up PT Goal Formulation: With patient/family Time For Goal Achievement: 10/12/13 Potential to Achieve Goals: Good  Visit Information  Last PT Received On: 09/28/13 Assistance Needed: +2 PT/OT/SLP Co-Evaluation/Treatment: Yes Reason for Co-Treatment: For patient/therapist safety PT goals addressed during session: Mobility/safety with mobility;Proper use of DME;Balance History of Present Illness: fall, metatarsal fracture, morbid obesity, inability to ambulate  , Cam boot, WBAT R foot       Prior Functioning  Home Living Family/patient expects to be discharged to:: Skilled nursing facility Living Arrangements: Other relatives Additional Comments: family not available 24/7 Prior Function Level of Independence: Independent with assistive device(s) Communication Communication: No difficulties    Cognition  Cognition Arousal/Alertness: Awake/alert Behavior During Therapy: WFL for tasks assessed/performed Overall Cognitive Status: Within Functional Limits for tasks assessed    Extremity/Trunk Assessment Upper Extremity Assessment Upper Extremity Assessment: Defer to OT evaluation Lower Extremity Assessment Lower Extremity Assessment: RLE deficits/detail;LLE deficits/detail;Generalized weakness RLE Deficits / Details: hip flexion 3= RLE Sensation: history of peripheral neuropathy LLE Sensation: history of peripheral neuropathy   Balance  Balance Balance Assessed: Yes  End of Session PT - End of Session Equipment Utilized During Treatment: Other (comment) Activity Tolerance: Patient limited by fatigue Patient left: in chair;with call bell/phone within reach;with family/visitor present Nurse  Communication: Mobility status  GP     Claretha Cooper 09/28/2013, 3:16 PM

## 2013-09-29 DIAGNOSIS — J3489 Other specified disorders of nose and nasal sinuses: Secondary | ICD-10-CM

## 2013-09-29 DIAGNOSIS — I5032 Chronic diastolic (congestive) heart failure: Secondary | ICD-10-CM

## 2013-09-29 LAB — CBC
HEMATOCRIT: 38.2 % (ref 36.0–46.0)
HEMOGLOBIN: 11.6 g/dL — AB (ref 12.0–15.0)
MCH: 23.6 pg — AB (ref 26.0–34.0)
MCHC: 30.4 g/dL (ref 30.0–36.0)
MCV: 77.8 fL — AB (ref 78.0–100.0)
Platelets: 213 10*3/uL (ref 150–400)
RBC: 4.91 MIL/uL (ref 3.87–5.11)
RDW: 16.5 % — ABNORMAL HIGH (ref 11.5–15.5)
WBC: 12.2 10*3/uL — ABNORMAL HIGH (ref 4.0–10.5)

## 2013-09-29 LAB — COMPREHENSIVE METABOLIC PANEL
ALT: 28 U/L (ref 0–35)
AST: 22 U/L (ref 0–37)
Albumin: 3.2 g/dL — ABNORMAL LOW (ref 3.5–5.2)
Alkaline Phosphatase: 91 U/L (ref 39–117)
BUN: 32 mg/dL — AB (ref 6–23)
CALCIUM: 8.8 mg/dL (ref 8.4–10.5)
CO2: 24 meq/L (ref 19–32)
Chloride: 100 mEq/L (ref 96–112)
Creatinine, Ser: 1.71 mg/dL — ABNORMAL HIGH (ref 0.50–1.10)
GFR, EST AFRICAN AMERICAN: 32 mL/min — AB (ref >90)
GFR, EST NON AFRICAN AMERICAN: 27 mL/min — AB (ref >90)
GLUCOSE: 111 mg/dL — AB (ref 70–99)
Potassium: 4.4 mEq/L (ref 3.7–5.3)
Sodium: 138 mEq/L (ref 137–147)
Total Bilirubin: 0.4 mg/dL (ref 0.3–1.2)
Total Protein: 6.4 g/dL (ref 6.0–8.3)

## 2013-09-29 MED ORDER — PREDNISONE 5 MG PO TABS: 5.0000 mg | ORAL_TABLET | Freq: Every day | ORAL | Status: AC

## 2013-09-29 MED ORDER — ALBUTEROL SULFATE HFA 108 (90 BASE) MCG/ACT IN AERS: 2.0000 | INHALATION_SPRAY | RESPIRATORY_TRACT | Status: AC | PRN

## 2013-09-29 NOTE — Progress Notes (Addendum)
09/28/13 1516  PT Time Calculation  PT Start Time 1400  PT Stop Time 1422  PT Time Calculation (min) 22 min  PT G-Codes **NOT FOR INPATIENT CLASS**  Functional Assessment Tool Used clinical judgement  Functional Limitation Mobility: Walking and moving around  Mobility: Walking and Moving Around Current Status (B9038) CM  Mobility: Walking and Moving Around Goal Status (B3383) CI  PT General Charges  $$ ACUTE PT VISIT 1 Procedure  PT Treatments  $Therapeutic Activity 8-22 mins  [place] [facility] [phone]

## 2013-09-29 NOTE — Progress Notes (Signed)
09/28/13 1516  OT Time Calculation  OT Start Time 1404  OT Stop Time 1426  OT Time Calculation (min) 22 min  OT G-codes **NOT FOR INPATIENT CLASS**  Functional Assessment Tool Used clinical observation and judgment  Functional Limitation Self care  Self Care Current Status (R9458) CK  Self Care Goal Status (P9292) CJ  OT General Charges  $OT Visit 1 Procedure  OT Evaluation  $Initial OT Evaluation Tier I 1 Procedure  Lesle Chris, OTR/L [phone] 09/29/2013

## 2013-09-29 NOTE — Progress Notes (Signed)
[facility] PROGRESS NOTE  Judy Ramirez DGL:[id number] DOB: [birth year] DOA: 09/27/2013 PCP: Judy Seller, MD  Assessment/Plan: Right foot fracture  -Per orthopedic surgery Dr. Melina Ramirez Recommendations: Convert posterior splint to CAM walker  F/U 2-3 weeks with Dr Judy Ramirez for ongoing non-operative management of foot fractures  WBAT in the CAM boot - walker may be required  HTN  -Not within AHA guidelines but considering patient's age and propensity for falling would not try to lower further  Pulmonary hypertension  -Will continue home medication  Chronic renal failure  -Stable awake nephrotoxic medication  -Followup with PCP to monitor at least quarterly  Nasal/sinus congestion  -Started DuoNeb  -Started Mucinex DM -wheezing on exam, sats stable -consider albuterol PRN with steroid taper  Code Status: Full Family Communication: Pt in room (indicate person spoken with, relationship, and if by phone, the number) Disposition Plan: Pending d/c to snf today   HPI/Subjective: No acute events noted overnight  Objective: Filed Vitals:   09/28/13 2145 09/28/13 2203 09/28/13 2207 09/29/13 0638  BP:    148/89  Pulse: 76  79 69  Temp: 99.5 F (37.5 C)   98.8 F (37.1 C)  TempSrc: Oral   Oral  Resp: 18  20 20   Height:      Weight:    144.2 kg (317 lb 14.5 oz)  SpO2: 94% 93% 95% 91%    Intake/Output Summary (Last 24 hours) at 09/29/13 0824 Last data filed at 09/28/13 1925  Gross per 24 hour  Intake    720 ml  Output   1100 ml  Net   -380 ml   Filed Weights   09/28/13 0537 09/28/13 1454 09/29/13 0638  Weight: 149.5 kg (329 lb 9.4 oz) 145.877 kg (321 lb 9.6 oz) 144.2 kg (317 lb 14.5 oz)    Exam:   General:  Awake, in nad  Cardiovascular: regular, s1, s2  Respiratory: normal resp effort, no wheezing  Abdomen: soft, nondistended  Musculoskeletal: perfused, no clubbing   Data Reviewed: Basic Metabolic Panel:  Recent Labs Lab 09/27/13 1607  09/28/13 0120 09/29/13 0514  NA 142 137 138  K 4.5 4.7 4.4  CL 108 101 100  CO2  --  23 24  GLUCOSE 119* 136* 111*  BUN 39* 34* 32*  CREATININE 1.80* 1.47* 1.71*  CALCIUM  --  8.9 8.8   Liver Function Tests:  Recent Labs Lab 09/28/13 0120 09/29/13 0514  AST 14 22  ALT 11 28  ALKPHOS 88 91  BILITOT 0.4 0.4  PROT 6.7 6.4  ALBUMIN 3.3* 3.2*   No results found for this basename: LIPASE, AMYLASE,  in the last 168 hours No results found for this basename: AMMONIA,  in the last 168 hours CBC:  Recent Labs Lab 09/27/13 1607 09/29/13 0514  WBC  --  12.2*  HGB 14.3 11.6*  HCT 42.0 38.2  MCV  --  77.8*  PLT  --  213   Cardiac Enzymes: No results found for this basename: CKTOTAL, CKMB, CKMBINDEX, TROPONINI,  in the last 168 hours BNP (last 3 results) No results found for this basename: PROBNP,  in the last 8760 hours CBG: No results found for this basename: GLUCAP,  in the last 168 hours  No results found for this or any previous visit (from the past 240 hour(s)).   Studies: Dg Hip Complete Right  09/27/2013   CLINICAL DATA:  Fall.  Right hip pain.  EXAM: RIGHT HIP - COMPLETE 2+ VIEW  COMPARISON:  09/25/2013.  FINDINGS: Patient's panniculus projects over the hips. L4-L5 posterior lumbar interbody fusion and left total hip arthroplasty are present. Pelvic rings are intact. The right hip shows moderate osteoarthritis with marginal osteophytes. There is no fracture.  IMPRESSION: No acute abnormality or interval change.   Electronically Signed   By: Judy Ramirez M.D.   On: 09/27/2013 15:56   Dg Foot Complete Right  09/27/2013   CLINICAL DATA:  Pain and bruising of the right foot.  EXAM: RIGHT FOOT COMPLETE - 3+ VIEW  COMPARISON:  None.  FINDINGS: Second through 5th mildly displaced metatarsal neck fractures are present. Minimal intra-articular extension is present at the 5th MTP joint. Mild to moderate 1st MTP joint osteoarthritis is present. Diffuse osteopenia. Midfoot  osteoarthritis is mild. Calcaneal spurs are present.  IMPRESSION: Mildly displaced 2nd through 5th metatarsal neck fractures.   Electronically Signed   By: Judy Ramirez M.D.   On: 09/27/2013 15:58    Scheduled Meds: . beta carotene w/minerals  1 tablet Oral Daily  . dextromethorphan-guaiFENesin  1 tablet Oral BID  . furosemide  40 mg Oral q morning - 10a  . gabapentin  900 mg Oral q morning - 10a  . heparin  5,000 Units Subcutaneous Q8H  . ipratropium-albuterol  3 mL Nebulization QID  . irbesartan  300 mg Oral Daily  . levothyroxine  137 mcg Oral QAC breakfast  . LORazepam  0.5 mg Oral QHS  . mupirocin cream   Topical Daily  . pantoprazole  40 mg Oral Daily  . potassium chloride SA  40 mEq Oral q morning - 10a  . sodium chloride  3 mL Intravenous Q12H   Continuous Infusions:   Active Problems:   HYPERTENSION, UNSPECIFIED   PULMONARY HYPERTENSION   Chronic diastolic heart failure   Foot fracture, right   Chronic renal failure   Nasal congestion   Time spent: 49min   Odies Ramirez, Judy Ramirez Pager [phone]. If 7PM-7AM, please contact night-coverage at [email or web address], password [facility] 09/29/2013, 8:24 AM  LOS: 2 days

## 2013-09-30 NOTE — Progress Notes (Signed)
Clinical Social Work Department CLINICAL SOCIAL WORK PLACEMENT NOTE 09/30/2013  Patient:  Judy Ramirez, Judy Ramirez  Account Number:  [id number] Admit date:  09/27/2013  Clinical Social Worker:  Werner Lean, LCSW  Date/time:  09/28/2013 02:23 PM  Clinical Social Work is seeking post-discharge placement for this patient at the following level of care:   SKILLED NURSING   (*CSW will update this form in Epic as items are completed)   09/28/2013  Patient/family provided with [facility] Department of Clinical Social Work's list of facilities offering this level of care within the geographic area requested by the patient (or if unable, by the patient's family).  09/28/2013  Patient/family informed of their freedom to choose among providers that offer the needed level of care, that participate in Medicare, Medicaid or managed care program needed by the patient, have an available bed and are willing to accept the patient.    Patient/family informed of [facility]' ownership interest in [facility], as well as of the fact that they are under no obligation to receive care at this facility.  PASARR submitted to EDS on 09/28/2013 PASARR number received from EDS on 09/28/2013  FL2 transmitted to all facilities in geographic area requested by pt/family on  09/28/2013 FL2 transmitted to all facilities within larger geographic area on   Patient informed that his/her managed care company has contracts with or will negotiate with  certain facilities, including the following:     Patient/family informed of bed offers received:  09/29/2013 Patient chooses bed at [facility] Physician recommends and patient chooses bed at    Patient to be transferred to [facility] on  09/29/2013 Patient to be transferred to facility by [facility]  The following physician request were entered in Epic:   Additional Comments:  [employer] auth receieved prior to d/c to  SNF.  Werner Lean LCSW [phone]

## 2013-10-01 ENCOUNTER — Inpatient Hospital Stay (HOSPITAL_COMMUNITY)
Admission: EM | Admit: 2013-10-01 | Discharge: 2013-10-24 | DRG: 871 | Disposition: E | Payer: Medicare HMO | Attending: Internal Medicine | Admitting: Internal Medicine

## 2013-10-01 ENCOUNTER — Emergency Department (HOSPITAL_COMMUNITY): Payer: Medicare HMO

## 2013-10-01 ENCOUNTER — Encounter (HOSPITAL_COMMUNITY): Payer: Self-pay | Admitting: Emergency Medicine

## 2013-10-01 DIAGNOSIS — I509 Heart failure, unspecified: Secondary | ICD-10-CM | POA: Diagnosis present

## 2013-10-01 DIAGNOSIS — S92909A Unspecified fracture of unspecified foot, initial encounter for closed fracture: Secondary | ICD-10-CM | POA: Diagnosis present

## 2013-10-01 DIAGNOSIS — Z79899 Other long term (current) drug therapy: Secondary | ICD-10-CM

## 2013-10-01 DIAGNOSIS — R0689 Other abnormalities of breathing: Secondary | ICD-10-CM

## 2013-10-01 DIAGNOSIS — S92901A Unspecified fracture of right foot, initial encounter for closed fracture: Secondary | ICD-10-CM

## 2013-10-01 DIAGNOSIS — A419 Sepsis, unspecified organism: Principal | ICD-10-CM | POA: Diagnosis present

## 2013-10-01 DIAGNOSIS — I5032 Chronic diastolic (congestive) heart failure: Secondary | ICD-10-CM | POA: Diagnosis present

## 2013-10-01 DIAGNOSIS — R0989 Other specified symptoms and signs involving the circulatory and respiratory systems: Secondary | ICD-10-CM

## 2013-10-01 DIAGNOSIS — G4733 Obstructive sleep apnea (adult) (pediatric): Secondary | ICD-10-CM | POA: Diagnosis present

## 2013-10-01 DIAGNOSIS — Z8542 Personal history of malignant neoplasm of other parts of uterus: Secondary | ICD-10-CM

## 2013-10-01 DIAGNOSIS — J9601 Acute respiratory failure with hypoxia: Secondary | ICD-10-CM

## 2013-10-01 DIAGNOSIS — R4182 Altered mental status, unspecified: Secondary | ICD-10-CM | POA: Diagnosis present

## 2013-10-01 DIAGNOSIS — I959 Hypotension, unspecified: Secondary | ICD-10-CM

## 2013-10-01 DIAGNOSIS — J9600 Acute respiratory failure, unspecified whether with hypoxia or hypercapnia: Secondary | ICD-10-CM | POA: Diagnosis present

## 2013-10-01 DIAGNOSIS — I2789 Other specified pulmonary heart diseases: Secondary | ICD-10-CM | POA: Diagnosis present

## 2013-10-01 DIAGNOSIS — R6520 Severe sepsis without septic shock: Secondary | ICD-10-CM

## 2013-10-01 DIAGNOSIS — N189 Chronic kidney disease, unspecified: Secondary | ICD-10-CM

## 2013-10-01 DIAGNOSIS — J189 Pneumonia, unspecified organism: Secondary | ICD-10-CM | POA: Diagnosis present

## 2013-10-01 DIAGNOSIS — Z96649 Presence of unspecified artificial hip joint: Secondary | ICD-10-CM

## 2013-10-01 DIAGNOSIS — I129 Hypertensive chronic kidney disease with stage 1 through stage 4 chronic kidney disease, or unspecified chronic kidney disease: Secondary | ICD-10-CM | POA: Diagnosis present

## 2013-10-01 DIAGNOSIS — N179 Acute kidney failure, unspecified: Secondary | ICD-10-CM | POA: Diagnosis present

## 2013-10-01 DIAGNOSIS — J09X2 Influenza due to identified novel influenza A virus with other respiratory manifestations: Secondary | ICD-10-CM | POA: Diagnosis present

## 2013-10-01 DIAGNOSIS — Z6841 Body Mass Index (BMI) 40.0 and over, adult: Secondary | ICD-10-CM

## 2013-10-01 DIAGNOSIS — R0609 Other forms of dyspnea: Secondary | ICD-10-CM

## 2013-10-01 HISTORY — DX: Disorder of kidney and ureter, unspecified: N28.9

## 2013-10-01 HISTORY — DX: Unspecified fracture of right foot, initial encounter for closed fracture: S92.901A

## 2013-10-01 HISTORY — DX: Pulmonary hypertension, unspecified: I27.20

## 2013-10-01 HISTORY — DX: Heart failure, unspecified: I50.9

## 2013-10-01 HISTORY — DX: Unspecified diastolic (congestive) heart failure: I50.30

## 2013-10-01 LAB — POCT I-STAT 3, ART BLOOD GAS (G3+)
ACID-BASE DEFICIT: 5 mmol/L — AB (ref 0.0–2.0)
Acid-base deficit: 7 mmol/L — ABNORMAL HIGH (ref 0.0–2.0)
Bicarbonate: 22.2 mEq/L (ref 20.0–24.0)
Bicarbonate: 23.8 mEq/L (ref 20.0–24.0)
O2 SAT: 93 %
O2 SAT: 92 %
PCO2 ART: 73.1 mmHg — AB (ref 35.0–45.0)
PH ART: 7.13 — AB (ref 7.350–7.450)
Patient temperature: 98.6
TCO2: 24 mmol/L (ref 0–100)
TCO2: 26 mmol/L (ref 0–100)
pCO2 arterial: 48.5 mmHg — ABNORMAL HIGH (ref 35.0–45.0)
pH, Arterial: 7.268 — ABNORMAL LOW (ref 7.350–7.450)
pO2, Arterial: 76 mmHg — ABNORMAL LOW (ref 80.0–100.0)
pO2, Arterial: 92 mmHg (ref 80.0–100.0)

## 2013-10-01 LAB — URINALYSIS, ROUTINE W REFLEX MICROSCOPIC
BILIRUBIN URINE: NEGATIVE
GLUCOSE, UA: NEGATIVE mg/dL
HGB URINE DIPSTICK: NEGATIVE
KETONES UR: NEGATIVE mg/dL
Leukocytes, UA: NEGATIVE
NITRITE: NEGATIVE
PH: 5 (ref 5.0–8.0)
Protein, ur: 30 mg/dL — AB
Specific Gravity, Urine: 1.021 (ref 1.005–1.030)
Urobilinogen, UA: 0.2 mg/dL (ref 0.0–1.0)

## 2013-10-01 LAB — CBC WITH DIFFERENTIAL/PLATELET
Basophils Absolute: 0 10*3/uL (ref 0.0–0.1)
Basophils Relative: 0 % (ref 0–1)
Eosinophils Absolute: 0 10*3/uL (ref 0.0–0.7)
Eosinophils Relative: 0 % (ref 0–5)
HCT: 45 % (ref 36.0–46.0)
Hemoglobin: 14.1 g/dL (ref 12.0–15.0)
Lymphocytes Relative: 4 % — ABNORMAL LOW (ref 12–46)
Lymphs Abs: 1.2 10*3/uL (ref 0.7–4.0)
MCH: 24.1 pg — AB (ref 26.0–34.0)
MCHC: 31.3 g/dL (ref 30.0–36.0)
MCV: 76.9 fL — ABNORMAL LOW (ref 78.0–100.0)
MONO ABS: 3 10*3/uL — AB (ref 0.1–1.0)
Monocytes Relative: 10 % (ref 3–12)
NEUTROS PCT: 86 % — AB (ref 43–77)
Neutro Abs: 25.9 10*3/uL — ABNORMAL HIGH (ref 1.7–7.7)
PLATELETS: 214 10*3/uL (ref 150–400)
RBC: 5.85 MIL/uL — ABNORMAL HIGH (ref 3.87–5.11)
RDW: 16.5 % — AB (ref 11.5–15.5)
WBC MORPHOLOGY: INCREASED
WBC: 30.1 10*3/uL — ABNORMAL HIGH (ref 4.0–10.5)

## 2013-10-01 LAB — POCT I-STAT, CHEM 8
BUN: 41 mg/dL — AB (ref 6–23)
CALCIUM ION: 1.18 mmol/L (ref 1.13–1.30)
Chloride: 104 mEq/L (ref 96–112)
Creatinine, Ser: 2.3 mg/dL — ABNORMAL HIGH (ref 0.50–1.10)
Glucose, Bld: 197 mg/dL — ABNORMAL HIGH (ref 70–99)
HEMATOCRIT: 47 % — AB (ref 36.0–46.0)
Hemoglobin: 16 g/dL — ABNORMAL HIGH (ref 12.0–15.0)
Potassium: 4.6 mEq/L (ref 3.7–5.3)
Sodium: 138 mEq/L (ref 137–147)
TCO2: 25 mmol/L (ref 0–100)

## 2013-10-01 LAB — URINE MICROSCOPIC-ADD ON

## 2013-10-01 LAB — PRO B NATRIURETIC PEPTIDE: PRO B NATRI PEPTIDE: 2792 pg/mL — AB (ref 0–450)

## 2013-10-01 LAB — CBC
HCT: 42.2 % (ref 36.0–46.0)
HEMOGLOBIN: 13.1 g/dL (ref 12.0–15.0)
MCH: 24.6 pg — AB (ref 26.0–34.0)
MCHC: 31 g/dL (ref 30.0–36.0)
MCV: 79.2 fL (ref 78.0–100.0)
PLATELETS: 217 10*3/uL (ref 150–400)
RBC: 5.33 MIL/uL — AB (ref 3.87–5.11)
RDW: 16.9 % — ABNORMAL HIGH (ref 11.5–15.5)
WBC: 30.8 10*3/uL — ABNORMAL HIGH (ref 4.0–10.5)

## 2013-10-01 LAB — POCT I-STAT TROPONIN I: TROPONIN I, POC: 0.06 ng/mL (ref 0.00–0.08)

## 2013-10-01 LAB — INFLUENZA PANEL BY PCR (TYPE A & B)
H1N1 flu by pcr: NOT DETECTED
INFLAPCR: POSITIVE — AB
Influenza B By PCR: NEGATIVE

## 2013-10-01 LAB — CG4 I-STAT (LACTIC ACID): LACTIC ACID, VENOUS: 2.12 mmol/L (ref 0.5–2.2)

## 2013-10-01 LAB — CREATININE, SERUM
Creatinine, Ser: 3.24 mg/dL — ABNORMAL HIGH (ref 0.50–1.10)
GFR calc Af Amer: 15 mL/min — ABNORMAL LOW (ref >90)
GFR, EST NON AFRICAN AMERICAN: 13 mL/min — AB (ref >90)

## 2013-10-01 MED ORDER — HEPARIN BOLUS VIA INFUSION
5000.0000 [IU] | Freq: Once | INTRAVENOUS | Status: AC
  Administered 2013-10-01: 5000 [IU] via INTRAVENOUS
  Filled 2013-10-01: qty 5000

## 2013-10-01 MED ORDER — PIPERACILLIN-TAZOBACTAM 3.375 G IVPB 30 MIN
3.3750 g | INTRAVENOUS | Status: AC
  Administered 2013-10-01: 3.375 g via INTRAVENOUS
  Filled 2013-10-01: qty 50

## 2013-10-01 MED ORDER — SODIUM CHLORIDE 0.9 % IV BOLUS (SEPSIS)
500.0000 mL | Freq: Once | INTRAVENOUS | Status: AC
  Administered 2013-10-01: 500 mL via INTRAVENOUS

## 2013-10-01 MED ORDER — VANCOMYCIN HCL 10 G IV SOLR: 2000.0000 mg | INTRAVENOUS | Status: DC

## 2013-10-01 MED ORDER — HEPARIN SODIUM (PORCINE) 5000 UNIT/ML IJ SOLN
5000.0000 [IU] | Freq: Three times a day (TID) | INTRAMUSCULAR | Status: DC
  Administered 2013-10-01 (×2): 5000 [IU] via SUBCUTANEOUS
  Filled 2013-10-01 (×3): qty 1

## 2013-10-01 MED ORDER — PIPERACILLIN-TAZOBACTAM 3.375 G IVPB
3.3750 g | Freq: Three times a day (TID) | INTRAVENOUS | Status: DC
  Administered 2013-10-01: 3.375 g via INTRAVENOUS
  Filled 2013-10-01 (×3): qty 50

## 2013-10-01 MED ORDER — CHLORHEXIDINE GLUCONATE 0.12 % MT SOLN
15.0000 mL | Freq: Two times a day (BID) | OROMUCOSAL | Status: DC
  Administered 2013-10-01: 15 mL via OROMUCOSAL
  Filled 2013-10-01 (×2): qty 15

## 2013-10-01 MED ORDER — BIOTENE DRY MOUTH MT LIQD: 15.0000 mL | Freq: Two times a day (BID) | OROMUCOSAL | Status: DC

## 2013-10-01 MED ORDER — HEPARIN (PORCINE) IN NACL 100-0.45 UNIT/ML-% IJ SOLN
1600.0000 [IU]/h | INTRAMUSCULAR | Status: DC
  Administered 2013-10-01: 1600 [IU]/h via INTRAVENOUS
  Filled 2013-10-01: qty 250

## 2013-10-01 MED ORDER — MORPHINE SULFATE 2 MG/ML IJ SOLN
1.0000 mg | INTRAMUSCULAR | Status: DC | PRN
  Administered 2013-10-01: 1 mg via INTRAVENOUS
  Filled 2013-10-01: qty 1

## 2013-10-01 MED ORDER — ACETAMINOPHEN 650 MG RE SUPP
650.0000 mg | Freq: Four times a day (QID) | RECTAL | Status: DC | PRN
  Administered 2013-10-01: 650 mg via RECTAL
  Filled 2013-10-01: qty 1

## 2013-10-01 MED ORDER — SODIUM CHLORIDE 0.9 % IV SOLN
Freq: Once | INTRAVENOUS | Status: AC
  Administered 2013-10-01: 11:00:00 via INTRAVENOUS

## 2013-10-01 MED ORDER — ACETAMINOPHEN 325 MG PO TABS: 650.0000 mg | ORAL_TABLET | Freq: Four times a day (QID) | ORAL | Status: DC | PRN

## 2013-10-01 MED ORDER — NALOXONE HCL 0.4 MG/ML IJ SOLN
0.2000 mg | Freq: Once | INTRAMUSCULAR | Status: AC
  Administered 2013-10-01: 0.2 mg via INTRAVENOUS
  Filled 2013-10-01: qty 1

## 2013-10-01 MED ORDER — VANCOMYCIN HCL 10 G IV SOLR
2000.0000 mg | INTRAVENOUS | Status: DC
  Filled 2013-10-01: qty 2000

## 2013-10-01 NOTE — ED Notes (Signed)
Pt placed on monitor, continuous pulse oximetry and blood pressure cuff

## 2013-10-01 NOTE — ED Notes (Signed)
Dr. Walsh at bedside

## 2013-10-01 NOTE — ED Notes (Signed)
Pt states she usually wears CPAP at night but did not use her machine last night, when asked if she just did not want to wear her CPAP pt shook her head in a up and down gesture for "yes" indicating she did not want to wear her CPAP last night.

## 2013-10-01 NOTE — ED Notes (Signed)
X-ray at bedside

## 2013-10-01 NOTE — Progress Notes (Signed)
Called by RN to room at this time. RN stated pt had been given morphine for increased WOB and increased RR. Pt Spo2 low 80's and VT 123. RT increased Pressure on BIPAP and increased FIo2. MD was paged along with Rapid Response by RN. RT will continue to monitor.

## 2013-10-01 NOTE — Progress Notes (Signed)
14:40 Transported pt on [facility] to 2C05

## 2013-10-01 NOTE — ED Notes (Signed)
Called bed control should receive a bed assignment in approximately 30 minutes. Family notified.

## 2013-10-01 NOTE — Progress Notes (Signed)
Interim progress note: Flu + BP is up to 120/60 with 500cc bolus  02 sats in high 80-low 90% on continuous BiPAP Overall patient status is improved from initial presentation. Continue broad antibiotics. No tamiflu on BiPAP and with somnolence.  No changes for now. Discussed with daughter and grandson.

## 2013-10-01 NOTE — ED Provider Notes (Signed)
He is a this is a follow up ofCcould not get it is unlikely I patient was short SN: [id number]     Arrival date & time 10/04/2013  0818 History   First MD Initiated Contact with Patient 10/06/2013 0820     Chief Complaint  Patient presents with  . Respiratory Distress   level V caveat due to shortness of breath and altered mental status. (Consider location/radiation/quality/duration/timing/severity/associated sxs/prior Treatment) The history is provided by the patient, the nursing home and the EMS personnel.   patient comes in for nursing home. Was reportedly normal this morning but then had worsening shortness of breath and altered mental status. Initial pulse ox was in the low 90s. She has a reported history of pulmonary hypertension, CHF. Patient received breathing treatments and oxygen. He was discharged from the hospital 2 days ago after foot fractures. Discharged home without surgery.  Past Medical History  Diagnosis Date  . Hypertension   . Sleep apnea   . Thyroid disease   . Pulmonary hypertension   . Diastolic heart failure   . CHF (congestive heart failure)   . Uterine cancer   . Renal disorder     chronic renal failure  . Foot fracture, right    Past Surgical History  Procedure Laterality Date  . Abdominal hysterectomy    . Back surgery    . Total hip arthroplasty    . Knee arthroscopy Bilateral    History reviewed. No pertinent family history. History  Substance Use Topics  . Smoking status: Never Smoker   . Smokeless tobacco: Never Used  . Alcohol Use: No   OB History   Grav Para Term Preterm Abortions TAB SAB Ect Mult Living                 Review of Systems  Unable to perform ROS   Allergies  Ciprofloxacin hcl; Crestor; Zocor; and Cephalexin  Home Medications   No current outpatient prescriptions on file. BP 146/62  Pulse 101  Temp(Src) 100 F (37.8 C) (Rectal)  Resp 42  Ht 5\' 7"  (1.702 m)  Wt 317 lb (143.79 kg)  BMI 49.64 kg/m2  SpO2  99% Physical Exam  Constitutional: She appears well-developed and well-nourished.  HENT:  Head: Normocephalic.  Eyes: Pupils are equal, round, and reactive to light.  Neck: Neck supple.  Cardiovascular:  Tachycardia  Pulmonary/Chest:  Tachypnea. No rales or harsh wheezes  Abdominal: There is no tenderness.  Patient is obese  Musculoskeletal: She exhibits edema.  Chronic edema bilateral lower extremities  Neurological:  Patient will look to voice. She will not yes or no but is having difficulty speaking. Maybe some altered mental status.  Skin: Skin is warm.    ED Course  Procedures (including critical care time) Labs Review Labs Reviewed  CBC WITH DIFFERENTIAL - Abnormal; Notable for the following:    WBC 30.1 (*)    RBC 5.85 (*)    MCV 76.9 (*)    MCH 24.1 (*)    RDW 16.5 (*)    Neutrophils Relative % 86 (*)    Lymphocytes Relative 4 (*)    Neutro Abs 25.9 (*)    Monocytes Absolute 3.0 (*)    All other components within normal limits  PRO B NATRIURETIC PEPTIDE - Abnormal; Notable for the following:    Pro B Natriuretic peptide (BNP) 2792.0 (*)    All other components within normal limits  URINALYSIS, ROUTINE W REFLEX MICROSCOPIC - Abnormal; Notable for the following:  APPearance CLOUDY (*)    Protein, ur 30 (*)    All other components within normal limits  URINE MICROSCOPIC-ADD ON - Abnormal; Notable for the following:    Bacteria, UA FEW (*)    Casts HYALINE CASTS (*)    All other components within normal limits  INFLUENZA PANEL BY PCR (TYPE A & B, H1N1) - Abnormal; Notable for the following:    Influenza A By PCR POSITIVE (*)    All other components within normal limits  POCT I-STAT, CHEM 8 - Abnormal; Notable for the following:    BUN 41 (*)    Creatinine, Ser 2.30 (*)    Glucose, Bld 197 (*)    Hemoglobin 16.0 (*)    HCT 47.0 (*)    All other components within normal limits  POCT I-STAT 3, BLOOD GAS (G3+) - Abnormal; Notable for the following:    pH,  Arterial 7.268 (*)    pCO2 arterial 48.5 (*)    pO2, Arterial 76.0 (*)    Acid-base deficit 5.0 (*)    All other components within normal limits  POCT I-STAT 3, BLOOD GAS (G3+) - Abnormal; Notable for the following:    pH, Arterial 7.130 (*)    pCO2 arterial 73.1 (*)    Acid-base deficit 7.0 (*)    All other components within normal limits  CULTURE, BLOOD (ROUTINE X 2)  CULTURE, BLOOD (ROUTINE X 2)  URINE CULTURE  CBC  CREATININE, SERUM  POCT I-STAT TROPONIN I  CG4 I-STAT (LACTIC ACID)   Imaging Review Dg Chest Port 1 View  27-Oct-2013   CLINICAL DATA:  Shortness of breath, obesity  EXAM: PORTABLE CHEST - 1 VIEW  COMPARISON:  May 18, 2011  FINDINGS: The heart size and mediastinal contours are stable. There is mild central pulmonary vascular congestion unchanged. The heart size is mildly enlarged. There is no focal pneumonia or pleural effusion. The visualized skeletal structures are stable.  IMPRESSION: Mild chronic pulmonary vascular congestion.   Electronically Signed   By: Abelardo Diesel M.D.   On: October 27, 2013 08:38    EKG Interpretation    Date/Time:  Oct 27, 2013 08:24:01 EST Ventricular Rate:  122 PR Interval:  173 QRS Duration: 101 QT Interval:  301 QTC Calculation: 429 R Axis:   98 Text Interpretation:  Sinus tachycardia Anterior infarct, old Confirmed by Deitrick Ferreri  MD, Gabryelle Whitmoyer ([id number]) on 27-Oct-2013 8:39:34 AM             CRITICAL CARE Performed by: Mackie Pai. Total critical care time: 30  Critical care time was exclusive of separately billable procedures and treating other patients. Critical care was necessary to treat or prevent imminent or life-threatening deterioration. Critical care was time spent personally by me on the following activities: development of treatment plan with patient and/or surrogate as well as nursing, discussions with consultants, evaluation of patient's response to treatment, examination of patient, obtaining history  from patient or surrogate, ordering and performing treatments and interventions, ordering and review of laboratory studies, ordering and review of radiographic studies, pulse oximetry and re-evaluation of patient's condition.  MDM   1. Acute respiratory failure with hypoxia   2. Acute on chronic renal failure   3. Hypercarbia   4. Hypotension   5. Severe sepsis     patient with shortness of breath and altered mental status. Recent admission for further fractures. Has had respiratory distress and if patient and desired intubation she would've been intubated. She has worsening renal function. She  is hypercarbic. She is hypertensive. She later developed a fever. X-ray showed some vascular congestion. Patient does not want overly aggressive methods. Started on Zosyn and vancomycin for presumed sepsis with fever Started on heparin the possibility of pulmonary embolism which recent hospital stay and immobility. Patient was given a small fluid bolus. Poor prognosis was given the family. Judicious fluid given due to CHF already on x-ray.  Jasper Riling. Alvino Chapel, MD 10/15/2013 [phone]

## 2013-10-01 NOTE — Progress Notes (Signed)
Pt received to 2C05 from ED; Pt on bipap; VSS except Respiratory Rate >30; Pt arousable with stimuli; foley in place; PIV in use; see complex assessment for skin problems; will continue to monitor.

## 2013-10-01 NOTE — ED Notes (Addendum)
EDP Pickering at bedside updating family on pt's current condition and plan of care due to pt is a limited code

## 2013-10-01 NOTE — Progress Notes (Signed)
ANTIBIOTIC CONSULT NOTE - INITIAL  Pharmacy Consult for Vancomycin and Zosyn Indication: sepsis  Allergies  Allergen Reactions  . Ciprofloxacin Hcl Diarrhea and Nausea And Vomiting  . Crestor [Rosuvastatin Calcium] Other (See Comments)    "didn't feel good"  . Zocor [Simvastatin - High Dose] Other (See Comments)    "didn't feel good"  . Cephalexin Diarrhea and Nausea And Vomiting    Patient Measurements: Height: 5\' 7"  (170.2 cm) Weight: 317 lb (143.79 kg) IBW/kg (Calculated) : 61.6 Adjusted Body Weight: 86 kg  Vital Signs: Temp: 102 F (38.9 C) (01/09 1036) Temp src: Rectal (01/09 1036) BP: 100/70 mmHg (01/09 1154) Pulse Rate: 110 (01/09 1154) Intake/Output from previous day:   Intake/Output from this shift: Total I/O In: 10 [Other:10] Out: 500 [Urine:500]  Labs:  Recent Labs  09/29/13 0514 10/09/2013 0830 10/18/2013 0848  WBC 12.2* 30.1*  --   HGB 11.6* 14.1 16.0*  PLT 213 214  --   CREATININE 1.71*  --  2.30*   Estimated Creatinine Clearance: 29.6 ml/min (by C-G formula based on Cr of 2.3). No results found for this basename: VANCOTROUGH, VANCOPEAK, VANCORANDOM, GENTTROUGH, GENTPEAK, GENTRANDOM, TOBRATROUGH, TOBRAPEAK, TOBRARND, AMIKACINPEAK, AMIKACINTROU, AMIKACIN,  in the last 72 hours   Microbiology: No results found for this or any previous visit (from the past 720 hour(s)).  Medical History: Past Medical History  Diagnosis Date  . Hypertension   . Sleep apnea   . Thyroid disease   . Pulmonary hypertension   . Diastolic heart failure   . CHF (congestive heart failure)   . Uterine cancer   . Renal disorder     chronic renal failure  . Foot fracture, right     Medications:  See electronic med rec  Assessment: 80 y.o. female admitted from NH with SOB, AMS, fever. Noted recent admit (d/c 1/6) for R foot fracture but no surgery done at this time. Pt to begin broad spectrum antibiotics (Vanc and Zosyn) for sepsis - source unclear, likely HCAP. WBC  elevated 30.1. Cultures pending.  SCr 2.3, est CrCl 20-30 ml/min.  1/9 Vanc>> 1/9 Zosyn>>  Goal of Therapy:  Vancomycin trough level 15-20 mcg/ml  Plan:  1. Vancomycin 2gm IV q48h. 2. Zosyn 3.375gm IV now over 30 min then 3.375gm IV q8h - subsequent doses over 4 hours 3. Will f/u micro data, renal function, trough at Css  Sherlon Handing, PharmD, BCPS Clinical pharmacist, pager [phone] 10/21/2013,11:58 AM

## 2013-10-01 NOTE — ED Notes (Signed)
Family at bedside. 

## 2013-10-01 NOTE — Progress Notes (Signed)
Utilization review completed. Sabrie Moritz, RN, BSN. 

## 2013-10-01 NOTE — ED Notes (Signed)
Myself and Knowles, NT assisted Magda Paganini, RN with a temp foley cath and then resituated pt on stretcher; Theresia Lo, NT collected an urine sample from foley and sent to lab for testing

## 2013-10-01 NOTE — H&P (Signed)
[facility] History and Physical  [name] TDH:[id number] DOB: 1932-03-24 DOA: 10/11/2013  Referring physician: Dr Rubin Payor PCP: Pamelia Hoit, MD   Chief Complaint: altered mental status, fever, respiratory distress  HPI: Judy Ramirez is a 80 y.o. female with medical history of Morbid obesity, HTN, chronic diastolic CHF, pulmonary hypertension, OSA and a recent right foot fracture for which she was recently hospitalized from 1/5-1/7.  She presents today from the rehab facility after one day of fever, fatigue, decreased oral intake and cough which progressed to altered mental status, somnolence and respiratory distress this morning.   Upon presentation to the ED 02 is in the low 90% with very labored breathing now on Bipap, CXR shows pulmonary vascular congestion, BP is 60's/40's, HR 120's and temp is 102.  She has a MOST form indicating her wish for no intubation or aggressive measures, limited resuscitation.   TRH is asked to admit.   Review of Systems:  Constitutional: No weight loss, night sweats, has had Fevers, chills, fatigue.  HEENT: No headaches, Difficulty swallowing,Tooth/dental problems,Sore throat,No sneezing, itching, ear ache, nasal congestion, post nasal drip,  Cardio-vascular: No chest pain, Orthopnea, PND, swelling in lower extremities, anasarca, dizziness, palpitations  GI: No heartburn, indigestion, abdominal pain, nausea, vomiting, diarrhea, change in bowel habits, has had loss of appetite  Resp: has had shortness of breath at rest. No excess mucus, no productive cough, has had non-productive cough, No coughing up of blood.No change in color of mucus.No wheezing.No chest wall deformity  Skin: no rash or lesions.  GU: no dysuria, change in color of urine, no urgency or frequency. No flank pain.  Musculoskeletal: No new joint pain or swelling. Does have bruising, pain from right foot fx, No decreased range of motion. No back pain.  Psych: No change in  mood or affect. No depression or anxiety. No memory loss.   Past Medical History  Diagnosis Date  . Hypertension   . Sleep apnea   . Thyroid disease   . Uterine cancer    Past Surgical History  Procedure Laterality Date  . Abdominal hysterectomy    . Back surgery    . Total hip arthroplasty    . Knee arthroscopy Bilateral    Social History:  reports that she has never smoked. She has never used smokeless tobacco. She reports that she does not drink alcohol or use illicit drugs.  Allergies  Allergen Reactions  . Ciprofloxacin Hcl Diarrhea and Nausea And Vomiting  . Crestor [Rosuvastatin Calcium] Other (See Comments)    "didn't feel good"  . Zocor [Simvastatin - High Dose] Other (See Comments)    "didn't feel good"  . Cephalexin Diarrhea and Nausea And Vomiting    History reviewed. No pertinent family history.   Prior to Admission medications   Medication Sig Start Date End Date Taking? Authorizing Provider  albuterol (PROVENTIL HFA;VENTOLIN HFA) 108 (90 BASE) MCG/ACT inhaler Inhale 2 puffs into the lungs every 4 (four) hours as needed for wheezing or shortness of breath. 09/29/13   Jerald Kief, MD  dextromethorphan-guaiFENesin [facility] DM) 30-600 MG per 12 hr tablet Take 1 tablet by mouth 2 (two) times daily. 09/28/13   Drema Dallas, MD  furosemide (LASIX) 40 MG tablet Take 40 mg by mouth every morning.     Historical Provider, MD  gabapentin (NEURONTIN) 300 MG capsule Take 900 mg by mouth every morning.    Historical Provider, MD  HYDROcodone-acetaminophen (NORCO/VICODIN) 5-325 MG per tablet Take 1-2 tablets by  mouth every 4 (four) hours as needed for moderate pain or severe pain.    Historical Provider, MD  levothyroxine (SYNTHROID, LEVOTHROID) 137 MCG tablet Take 137 mcg by mouth daily before breakfast.     Historical Provider, MD  Multiple Vitamins-Minerals (PRESERVISION AREDS PO) Take 2 tablets by mouth every morning.     Historical Provider, MD  olmesartan (BENICAR) 40 MG  tablet Take 40 mg by mouth every morning.     Historical Provider, MD  omeprazole (PRILOSEC) 20 MG capsule Take 20 mg by mouth every morning.     Historical Provider, MD  potassium chloride SA (K-DUR,KLOR-CON) 20 MEQ tablet Take 40 mEq by mouth every morning.     Historical Provider, MD  predniSONE (DELTASONE) 5 MG tablet Take 1 tablet (5 mg total) by mouth daily with breakfast. 09/29/13   Donne Hazel, MD   Physical Exam: Filed Vitals:   10/14/2013 1036  BP: 61/45  Pulse: 114  Temp: 102 F (38.9 C)  Resp: 31    BP 61/45  Pulse 114  Temp(Src) 102 F (38.9 C) (Rectal)  Resp 31  Ht 5\' 7"  (1.702 m)  Wt 143.79 kg (317 lb)  BMI 49.64 kg/m2  SpO2 93%  General:  Respiratory distress on Bipap,  Eyes: PERRL, normal lids, irises & conjunctiva ENT: grossly normal hearing, dry lips & tongue Neck: no LAD, masses or thyromegaly Cardiovascular: irregular, tachycardic, no murmur noted, No LE edema. Telemetry: SR tachy, no arrhythmias  Respiratory: increased work of breathing, rapid shallow resps, BiPAP, coarse breath sounds. Abdomen: soft, ntnd Skin: no rash or induration seen on limited exam, pale Musculoskeletal: grossly normal tone BUE/BLE Psychiatric: unable to assess Neurologic: unable to assess          Labs on Admission:  Basic Metabolic Panel:  Recent Labs Lab 09/27/13 1607 09/28/13 0120 09/29/13 0514 10/17/2013 0848  NA 142 137 138 138  K 4.5 4.7 4.4 4.6  CL 108 101 100 104  CO2  --  23 24  --   GLUCOSE 119* 136* 111* 197*  BUN 39* 34* 32* 41*  CREATININE 1.80* 1.47* 1.71* 2.30*  CALCIUM  --  8.9 8.8  --    Liver Function Tests:  Recent Labs Lab 09/28/13 0120 09/29/13 0514  AST 14 22  ALT 11 28  ALKPHOS 88 91  BILITOT 0.4 0.4  PROT 6.7 6.4  ALBUMIN 3.3* 3.2*   No results found for this basename: LIPASE, AMYLASE,  in the last 168 hours No results found for this basename: AMMONIA,  in the last 168 hours CBC:  Recent Labs Lab 09/27/13 1607 09/29/13 0514  10/04/2013 0830 09/27/2013 0848  WBC  --  12.2* 30.1*  --   NEUTROABS  --   --  25.9*  --   HGB 14.3 11.6* 14.1 16.0*  HCT 42.0 38.2 45.0 47.0*  MCV  --  77.8* 76.9*  --   PLT  --  213 214  --    Cardiac Enzymes: No results found for this basename: CKTOTAL, CKMB, CKMBINDEX, TROPONINI,  in the last 168 hours  BNP (last 3 results)  Recent Labs  10/12/2013 0830  PROBNP 2792.0*   CBG: No results found for this basename: GLUCAP,  in the last 168 hours  Radiological Exams on Admission: Dg Chest Port 1 View  10/08/2013   CLINICAL DATA:  Shortness of breath, obesity  EXAM: PORTABLE CHEST - 1 VIEW  COMPARISON:  May 18, 2011  FINDINGS: The heart size and  mediastinal contours are stable. There is mild central pulmonary vascular congestion unchanged. The heart size is mildly enlarged. There is no focal pneumonia or pleural effusion. The visualized skeletal structures are stable.  IMPRESSION: Mild chronic pulmonary vascular congestion.   Electronically Signed   By: Abelardo Diesel M.D.   On: 2013-10-22 08:38    EKG: NSR tachy  Assessment/Plan 1.  Acute respiratory failure with hypoxia: For now continue with BiPAP and admit to SDU.  CXR shows overload, but pressure very low so will not be able to diurese.  With fever have gotten flu swab and starting empiric abx for hospital acquired PNA.  Limited interventions- no intubation.  2. Severe sepsis: Source is not clear. Fever, leucocytosis (30K), tachycardia, hypotension. Start empiric antibiotics for HCAP.  Blood cultures, urine cultures, flu swab ordered.   3. Hypercarbia: BiPAP.  She does use CPAP at night and family reports that they think it was on last night. 4. Hypotension: attempting to support pressure with fluids.  No central lines per MOST form so pressors would be difficult.     5. Acute on chronic renal failure: likely due to hypotension and decreased PO intake. Fluids. Monitor. 6. PULMONARY HYPERTENSION 7.   Chronic diastolic heart  failure 8.   Foot fracture, right   Code Status: limited code, see MOST form.  No intubation, central lines, cardioversion or chest compressions. Family Communication: spoke with daughter and granddaughter at bedside. Disposition Plan: difficult situation. For now admit to SDU and work with BiPAP and fluids for respiratory and pressure support.  Will need to monitor closely.  Time spent: 50 minutes  [facility] Pager [phone]

## 2013-10-01 NOTE — ED Notes (Signed)
Pt here by [facility] ems for sob, pt per staff was fine and "normal" at 0630 and at 0730 was weak, with sob and shallow respirations, pt given 5 of albuterol with ems, 18 g sl in lac per ems. Recent foot surgery on jan 6th

## 2013-10-01 NOTE — Progress Notes (Addendum)
1200 Heparin d/c by admitting MD  [place] for Heparin (d/c by admitting MD) Indication: r/o PE  Allergies  Allergen Reactions  . Ciprofloxacin Hcl Diarrhea and Nausea And Vomiting  . Crestor [Rosuvastatin Calcium] Other (See Comments)    "didn't feel good"  . Zocor [Simvastatin - High Dose] Other (See Comments)    "didn't feel good"  . Cephalexin Diarrhea and Nausea And Vomiting    Patient Measurements: Height: 5\' 7"  (170.2 cm) Weight: 317 lb (143.79 kg) IBW/kg (Calculated) : 61.6 Heparin Dosing Weight: 97 kg  Vital Signs: Temp: 99.4 F (37.4 C) (01/09 0825) Temp src: Oral (01/09 0825) BP: 106/88 mmHg (01/09 0845) Pulse Rate: 121 (01/09 0845)  Labs:  Recent Labs  09/29/13 0514 09/30/2013 0830 09/27/2013 0848  HGB 11.6* 14.1 16.0*  HCT 38.2 45.0 47.0*  PLT 213 214  --   CREATININE 1.71*  --  2.30*    Estimated Creatinine Clearance: 29.6 ml/min (by C-G formula based on Cr of 2.3).   Medical History: Past Medical History  Diagnosis Date  . Hypertension   . Sleep apnea   . Thyroid disease   . Uterine cancer     Medications:  See electronic med rec  Assessment: 80 y.o. female presents from NH with respiratory distress. Noted recent admit (d/c 1/6) for R foot fracture but no surgery done at this time. To begin heparin for r/o PE. SCr 2.3, est CrCl 20-30 ml/min. CBC stable at baseline.  Goal of Therapy:  Heparin level 0.3-0.7 units/ml Monitor platelets by anticoagulation protocol: Yes   Plan:  1. Heparin IV bolus 5000 units 2. Heparin IV gtt at 1600 units/hr 3. Will f/u 8 hr heparin level 4. Daily CBC and heparin level  Sherlon Handing, PharmD, BCPS Clinical pharmacist, pager [phone] 10/15/2013,10:05 AM

## 2013-10-01 NOTE — ED Notes (Signed)
Lactic acid results shown to Dr. Alvino Chapel

## 2013-10-02 LAB — URINE CULTURE
Colony Count: >=100000
SPECIAL REQUESTS: NORMAL

## 2013-10-04 NOTE — Discharge Summary (Signed)
Death Summary  Patient passed at 11:45 PM on 16-Oct-2013 due to respiratory failure from acute severe influenza A infection .  Pronounced by Ladell Heads RN.  Family present. Limited code, MOST form completed by patient prior to admission.  See H&P (brief summary below)  Chief Complaint: altered mental status, fever, respiratory distress  HPI: Judy Ramirez is a 80 y.o. female with medical history of Morbid obesity, HTN, chronic diastolic CHF, pulmonary hypertension, OSA and a recent right foot fracture for which she was recently hospitalized from 1/5-1/7. She presents today from the rehab facility after one day of fever, fatigue, decreased oral intake and cough which progressed to altered mental status, somnolence and respiratory distress this morning. Upon presentation to the ED 02 is in the low 90% with very labored breathing now on Bipap, CXR shows pulmonary vascular congestion, BP is 60's/40's, HR 120's and temp is 102. She has a MOST form indicating her wish for no intubation or aggressive measures, limited resuscitation. [facility] is asked to admit.   Assessment/Plan  1. Acute respiratory failure with hypoxia: For now continue with BiPAP and admit to SDU. CXR shows overload, but pressure very low so will not be able to diurese. With fever have gotten flu swab and starting empiric abx for hospital acquired PNA. Limited interventions- no intubation.  2. Severe sepsis: Source is not clear. Fever, leucocytosis (30K), tachycardia, hypotension. Start empiric antibiotics for HCAP. Blood cultures, urine cultures, flu swab ordered.  3. Hypercarbia: BiPAP. She does use CPAP at night and family reports that they think it was on last night. 4. Hypotension: attempting to support pressure with fluids. No central lines per MOST form so pressors would be difficult.  5. Acute on chronic renal failure: likely due to hypotension and decreased PO intake. Fluids. Monitor. 6. PULMONARY HYPERTENSION 7. Chronic diastolic  heart failure 8. Foot fracture, right

## 2013-10-07 LAB — CULTURE, BLOOD (ROUTINE X 2)
Culture: NO GROWTH
Culture: NO GROWTH

## 2013-10-24 NOTE — Progress Notes (Signed)
Pt in Sinus brady in the 50's. Dr Rogue Bussing notified, [place] notified and rapid response notified. Pt is a limited code with meds only. Daughter at bedside and is okay with her status change. Daughter says 'she is tired and ready'. Daughter notifying rest of the family. HR slowing down rapidly. Will continue to monitor.  Jovita Kussmaul

## 2013-10-24 NOTE — Progress Notes (Signed)
  Pt expired at 2330.  Asystole on monitor. Bipap turned off. Death confirmed by two RNs. Pt has no palpable pulse, no visible chest rise, no heart tones on auscultation and no pupillary response. Family at bedside. Dr. Rogue Bussing notified. Comfort provided to family.  Ladell Heads, RN.

## 2013-10-24 NOTE — Progress Notes (Signed)
Called by bedside RN regarding patient's vitals and code status. Patient limited code and currently has pulse per bedside RN. Family does not want aggressive measures. Upon arrival patient was pulseless and apneic. Family and bedside RN at bedside. Comfort and support offered.

## 2013-10-24 NOTE — Progress Notes (Signed)
Daughter concerned that patient was working too hard on breathing. Pt tachypneic with a rate of 41. Accessory muscles in use. Pt is withdrawing to deep pain only. HR up to 110. Pt given 1 mg of morphine to help decrease her work of breathing. Will continue to monitor.  Jovita Kussmaul

## 2013-10-24 DEATH — deceased

## 2015-01-26 IMAGING — CR DG HIP COMPLETE 2+V*R*
3 series · 3 of 3 positions shown · non-contrast
Comparison: Right knee radiographs 09/25/2013

CLINICAL DATA: Unable to stand or bear weight on leg for 5 days. No
known injury.

EXAM:
RIGHT HIP - COMPLETE 2+ VIEW

[t pelvis ap]
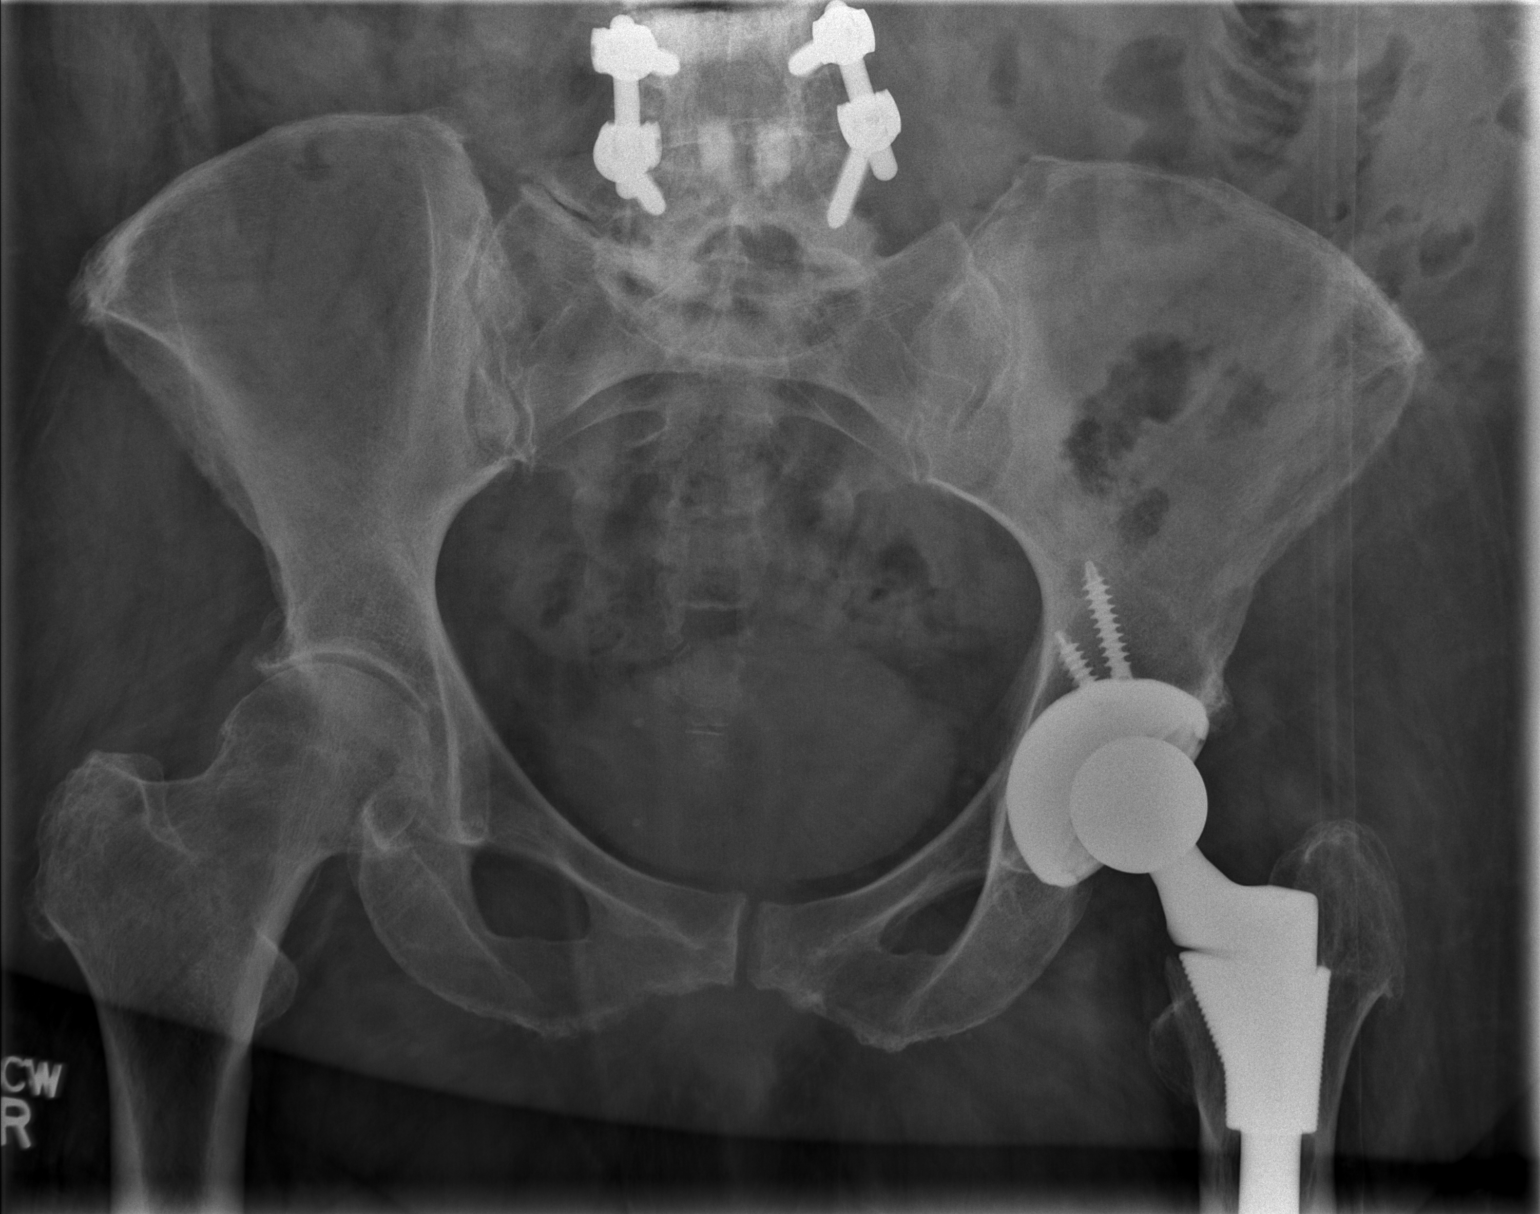

[t hip ap right]
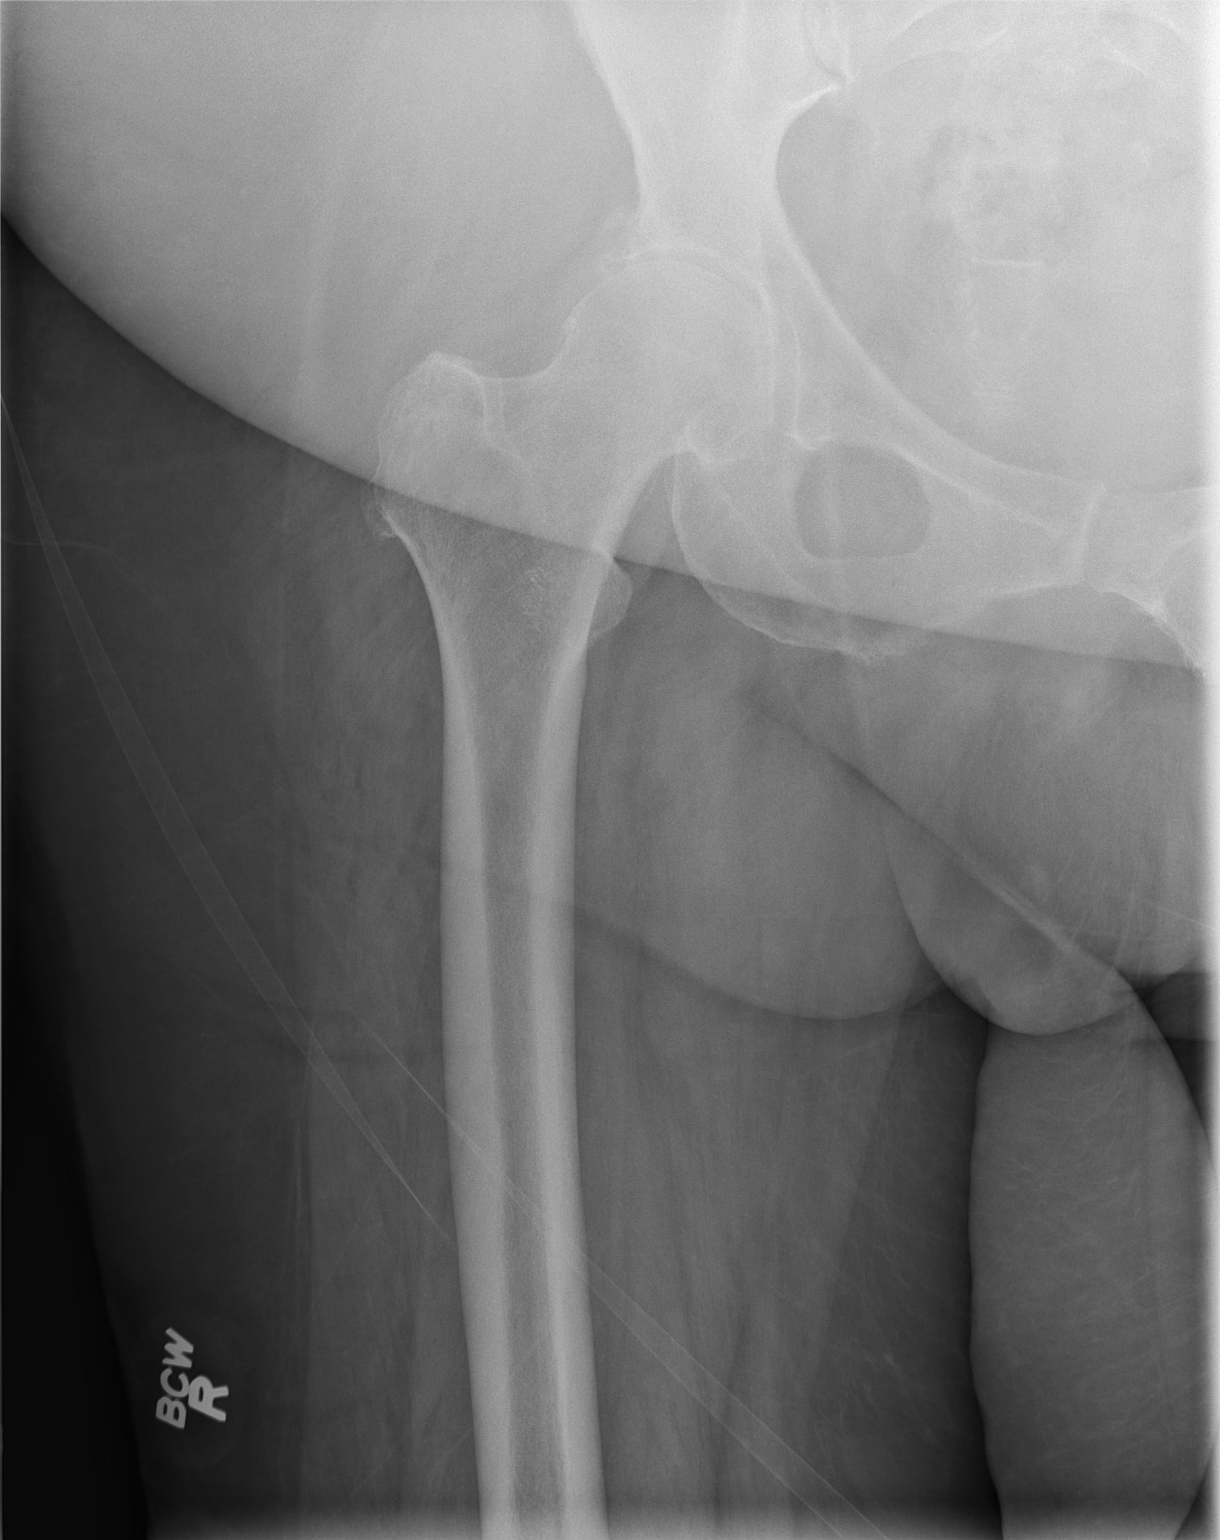

[t hip frog leg right]
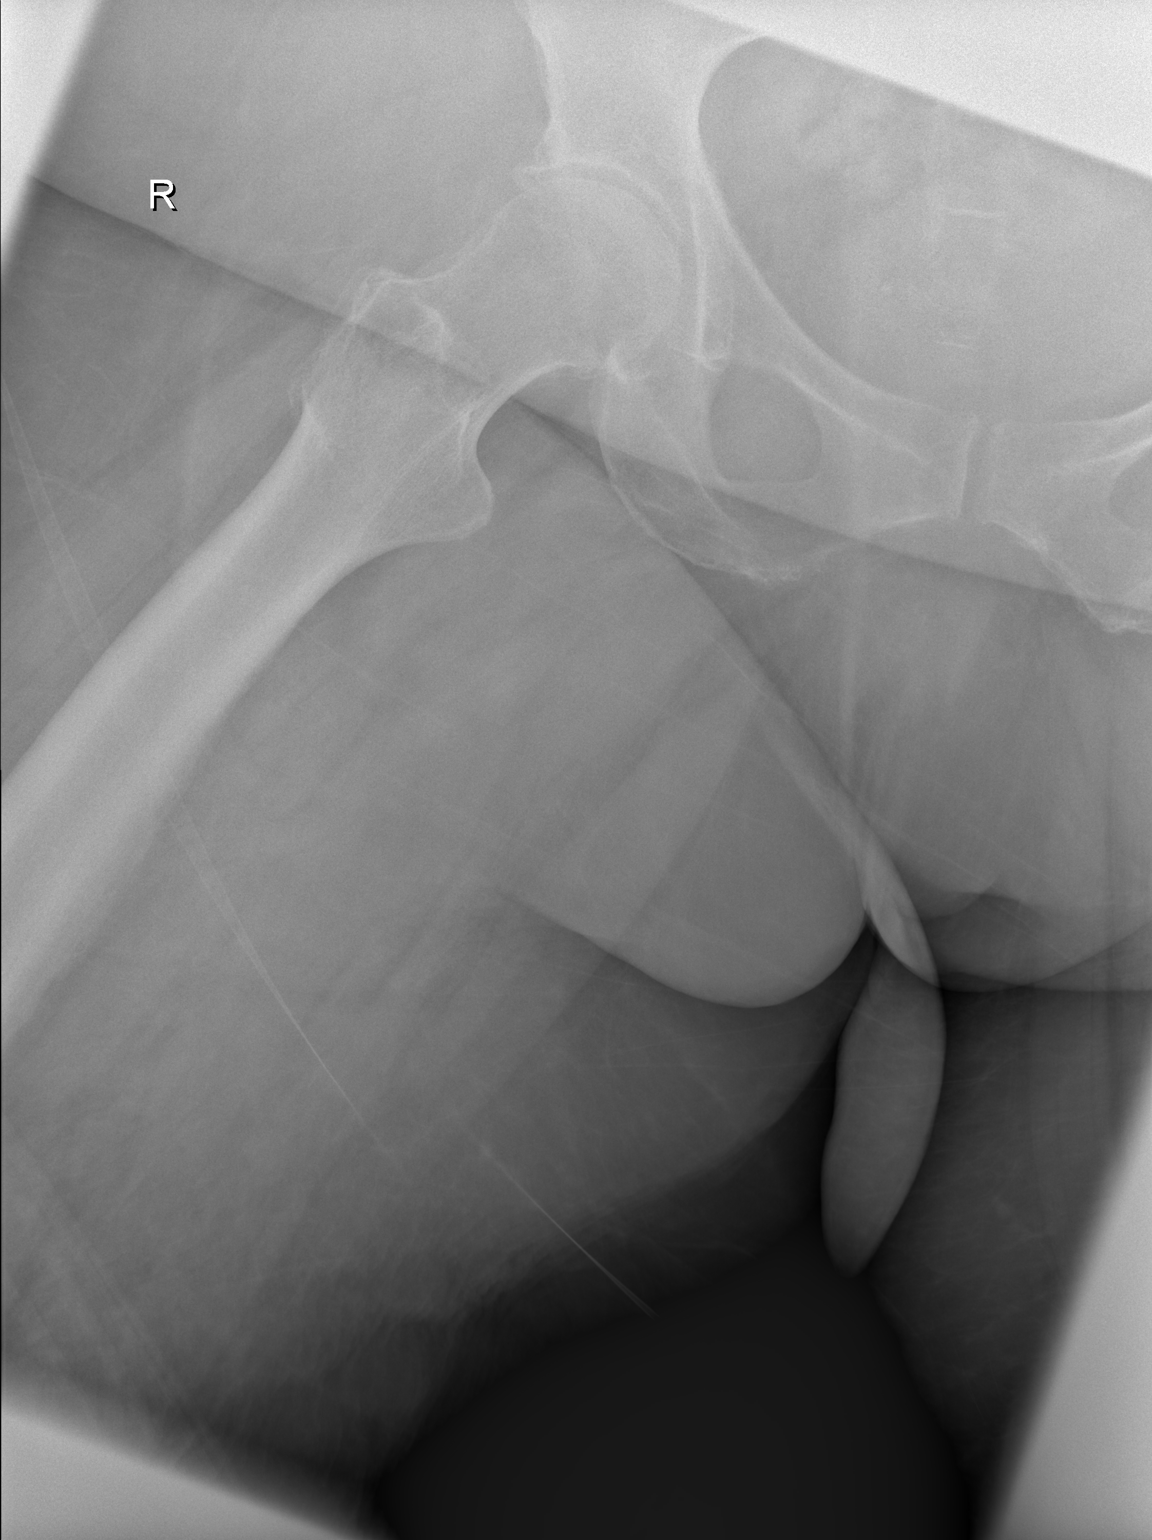

[3 of 3 positions shown; findings below may reference images not displayed]

FINDINGS: There are postoperative changes of lower lumbar spine fusion. Left
hip total arthroplasty is noted. The femoral stem component is not
completely included on this view. There is mild joint space
narrowing and lateral spur formation of the right hip. Right hip is
located. No acute fracture is identified. Visualized bowel gas
pattern nonobstructive.
IMPRESSION: Mild osteoarthritis of the right hip. Postsurgical changes of the
lumbar spine and left hip arthroplasty. No acute bony abnormality.

## 2015-01-28 IMAGING — CR DG FOOT COMPLETE 3+V*R*
3 series · 3 of 3 positions shown · non-contrast
Comparison: None.

CLINICAL DATA: Pain and bruising of the right foot.

EXAM:
RIGHT FOOT COMPLETE - 3+ VIEW

[x foot ap right]
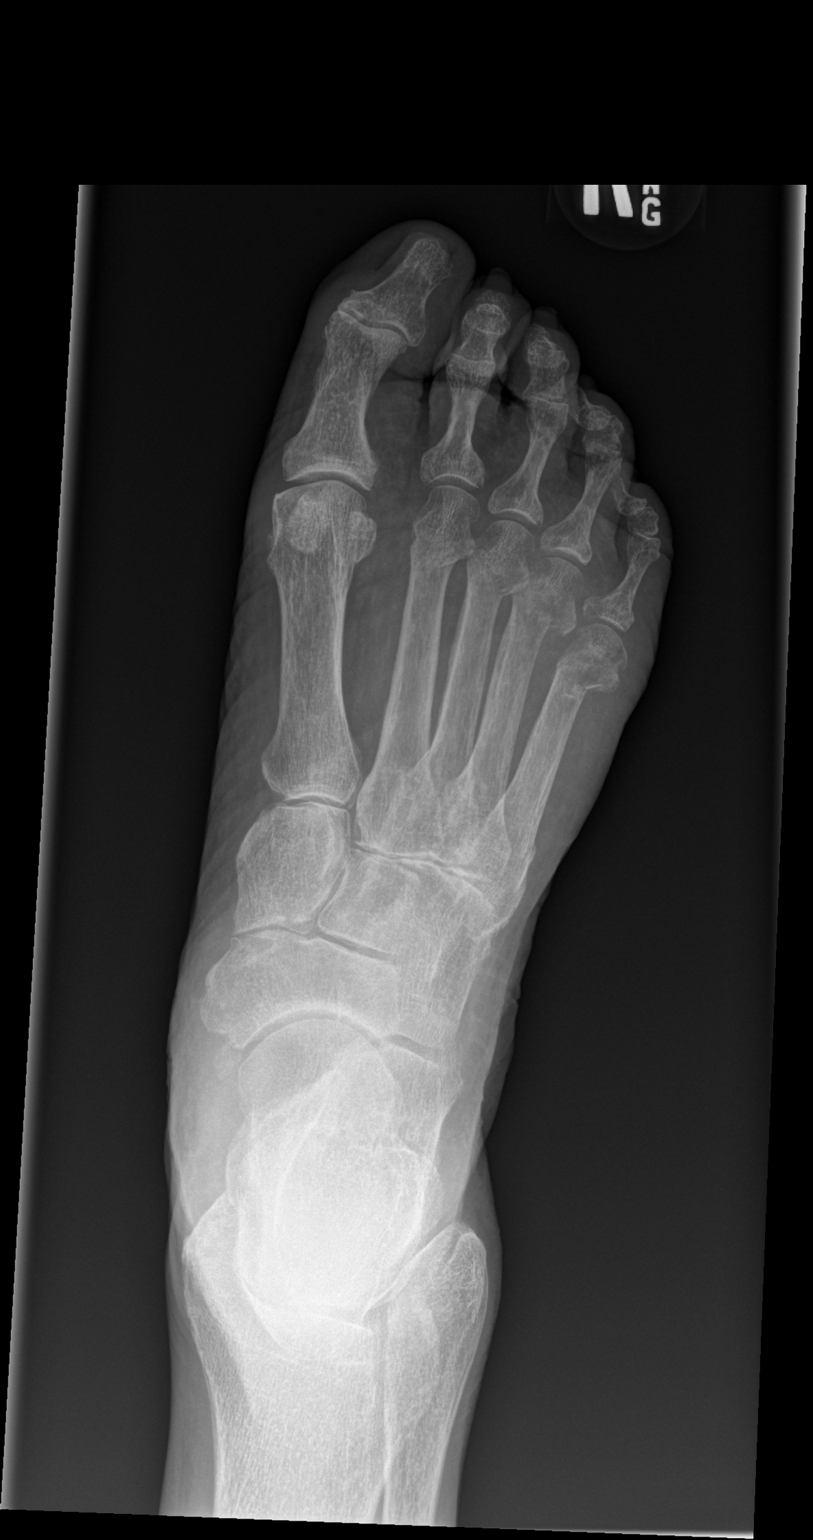

[x foot obl right]
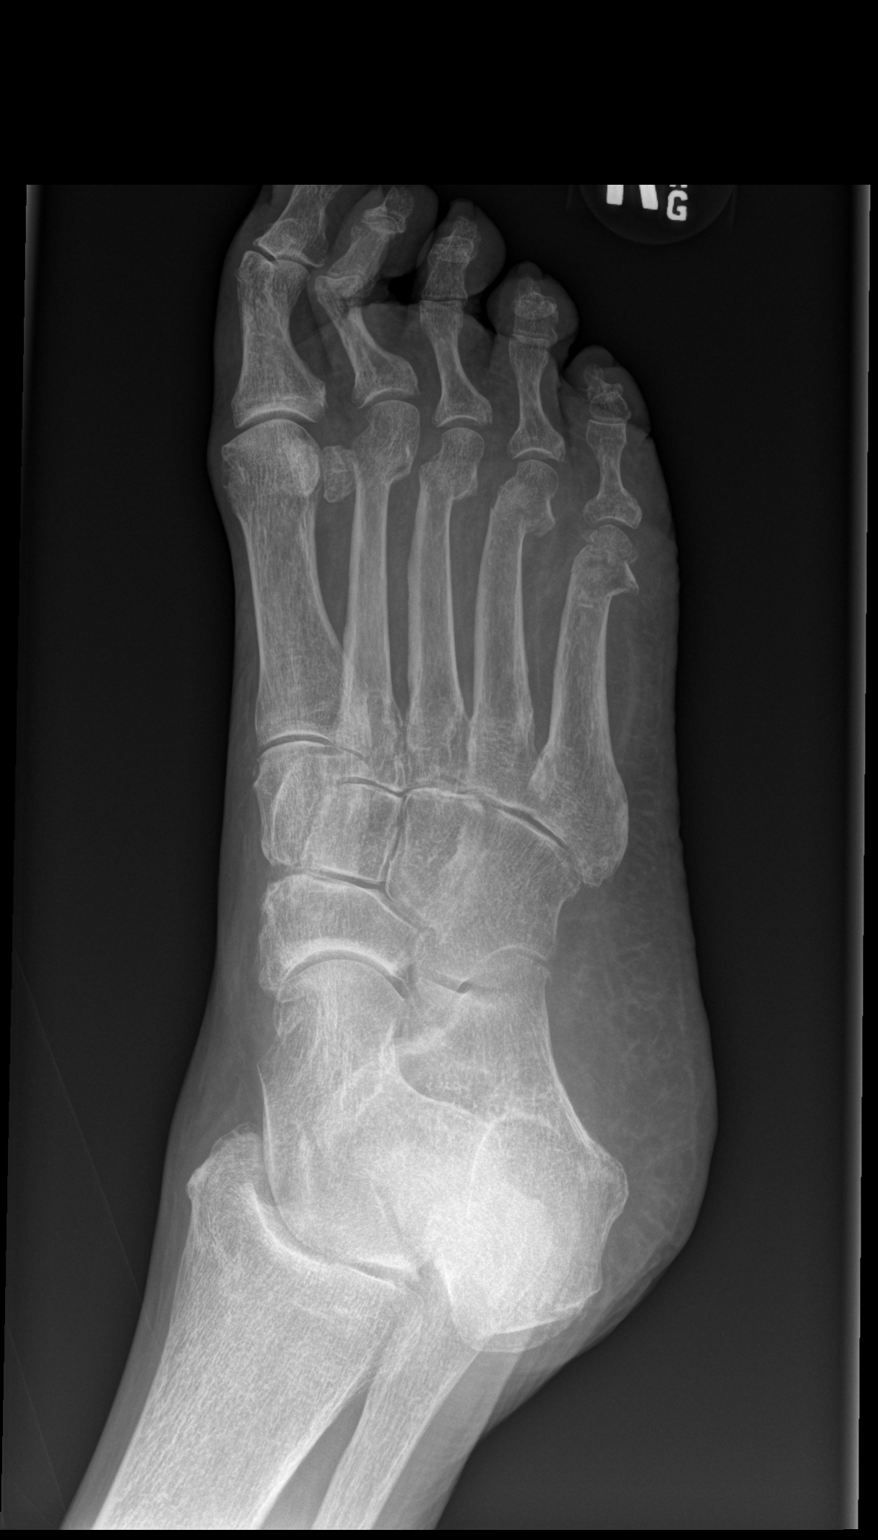

[x foot lat right]
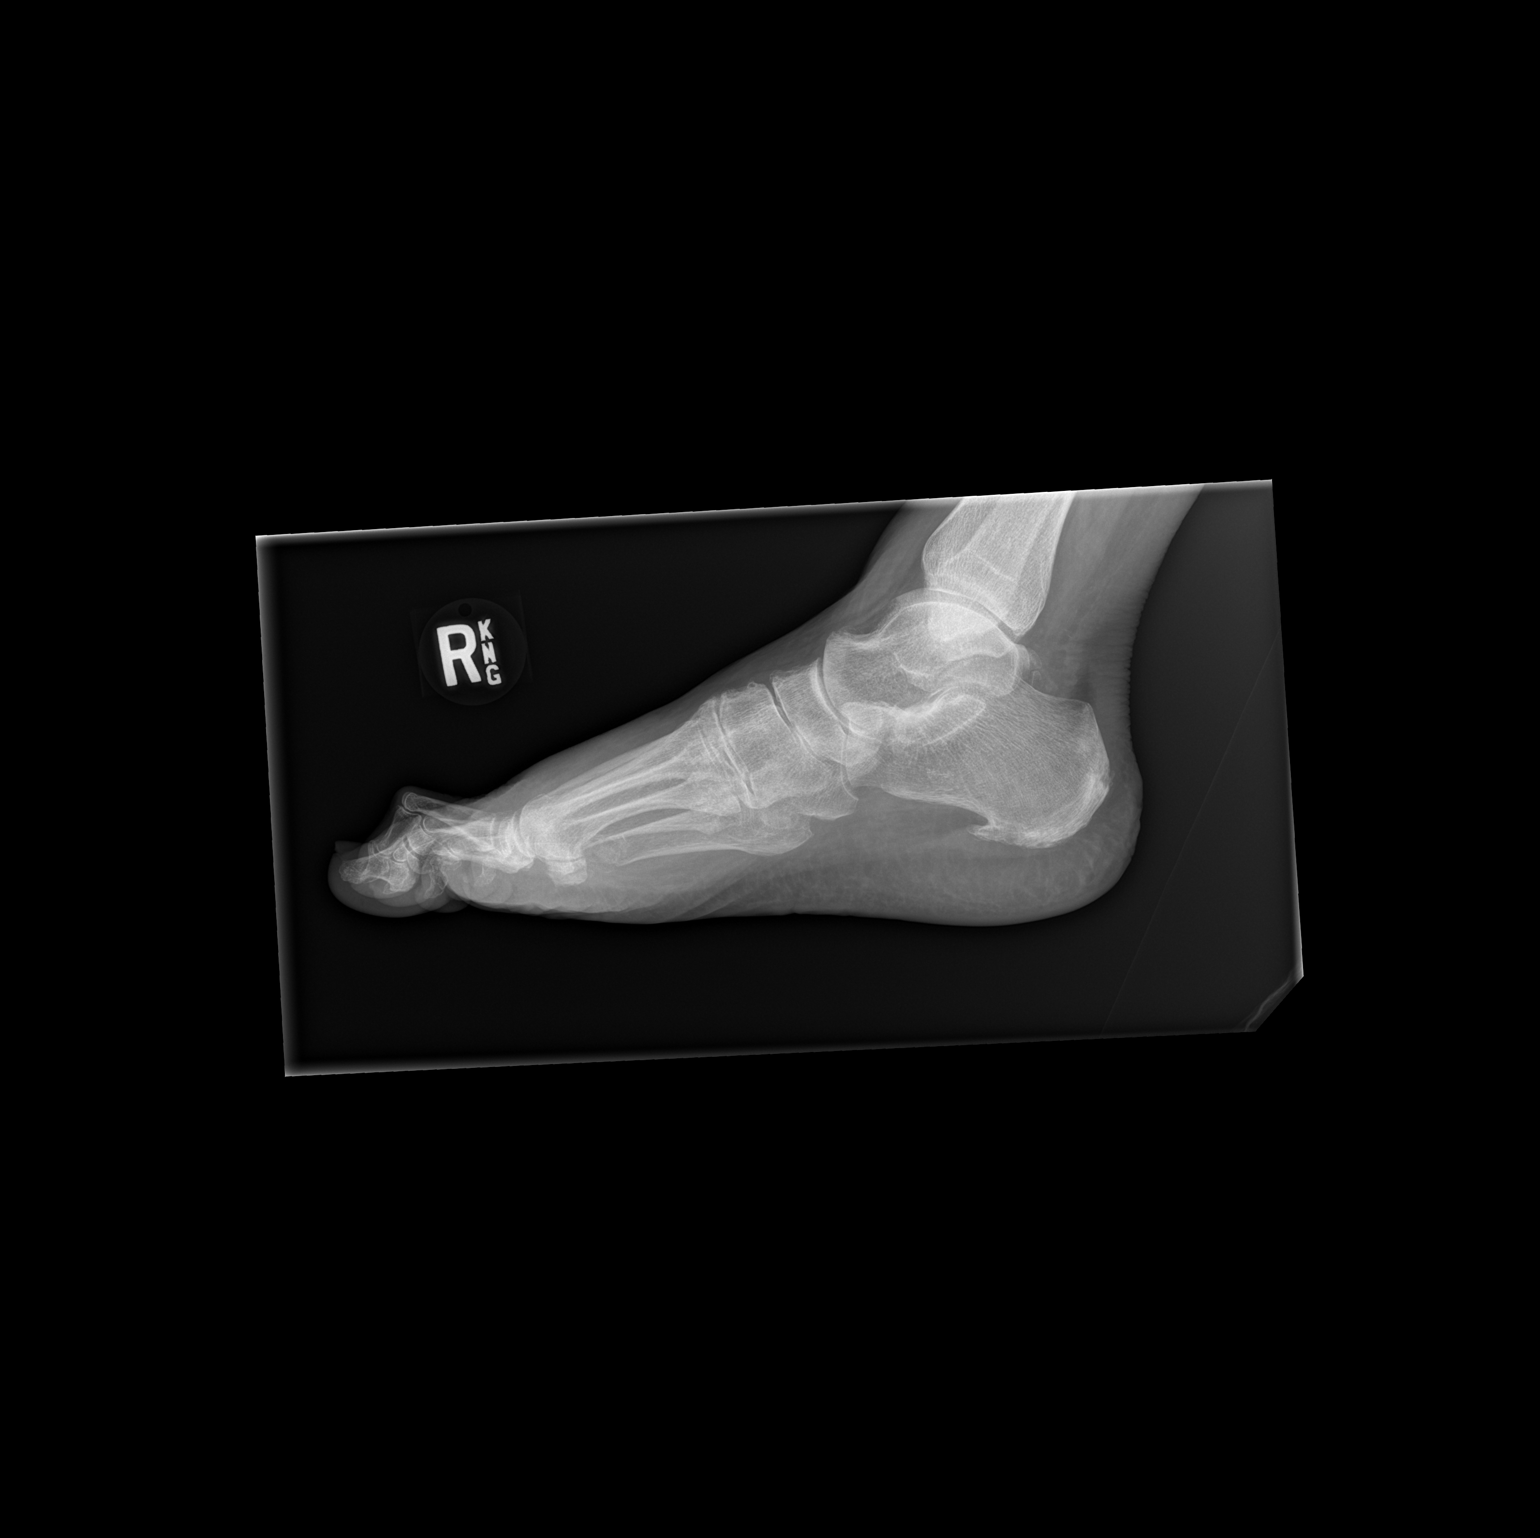

[3 of 3 positions shown; findings below may reference images not displayed]

FINDINGS: Second through 5th mildly displaced metatarsal neck fractures are
present. Minimal intra-articular extension is present at the 5th MTP
joint. Mild to moderate 1st MTP joint osteoarthritis is present.
Diffuse osteopenia. Midfoot osteoarthritis is mild. Calcaneal spurs
are present.
IMPRESSION: Mildly displaced 2nd through 5th metatarsal neck fractures.
